# Patient Record
Sex: Female | Born: 1995
Health system: Southern US, Community
[De-identification: ages and names within clinical notes are randomized; demographics above are authoritative.]

## PROBLEM LIST (undated history)

## (undated) DIAGNOSIS — I10 Essential (primary) hypertension: Secondary | ICD-10-CM

## (undated) DIAGNOSIS — D689 Coagulation defect, unspecified: Secondary | ICD-10-CM

## (undated) DIAGNOSIS — F419 Anxiety disorder, unspecified: Secondary | ICD-10-CM

## (undated) DIAGNOSIS — F32A Depression, unspecified: Secondary | ICD-10-CM

## (undated) DIAGNOSIS — D649 Anemia, unspecified: Secondary | ICD-10-CM

## (undated) HISTORY — DX: Depression, unspecified: F32.A

## (undated) HISTORY — DX: Anemia, unspecified: D64.9

## (undated) HISTORY — DX: Essential (primary) hypertension: I10

## (undated) HISTORY — DX: Anxiety disorder, unspecified: F41.9

## (undated) HISTORY — DX: Coagulation defect, unspecified: D68.9

---
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
## 2017-11-16 ENCOUNTER — Ambulatory Visit: Payer: BLUE CROSS/BLUE SHIELD | Admitting: Family Medicine

## 2018-01-07 ENCOUNTER — Encounter: Payer: Self-pay | Admitting: Family Medicine

## 2018-01-07 ENCOUNTER — Ambulatory Visit: Payer: BLUE CROSS/BLUE SHIELD | Admitting: Family Medicine

## 2018-01-07 DIAGNOSIS — F4010 Social phobia, unspecified: Secondary | ICD-10-CM | POA: Insufficient documentation

## 2018-01-07 DIAGNOSIS — Z0000 Encounter for general adult medical examination without abnormal findings: Secondary | ICD-10-CM | POA: Diagnosis not present

## 2018-01-07 DIAGNOSIS — L83 Acanthosis nigricans: Secondary | ICD-10-CM

## 2018-01-07 DIAGNOSIS — Z23 Encounter for immunization: Secondary | ICD-10-CM | POA: Diagnosis not present

## 2018-01-07 DIAGNOSIS — N920 Excessive and frequent menstruation with regular cycle: Secondary | ICD-10-CM | POA: Diagnosis not present

## 2018-01-07 MED ORDER — TETANUS-DIPHTH-ACELL PERTUSSIS 5-2.5-18.5 LF-MCG/0.5 IM SUSP
0.5000 mL | Freq: Once | INTRAMUSCULAR | Status: AC
  Administered 2018-01-07: 0.5 mL via INTRAMUSCULAR

## 2018-01-07 NOTE — Assessment & Plan Note (Addendum)
Patient is not interested in medicine at this time; would consider low dose SSRI if desired in the future; see AVS; suggested therapy, but she declined

## 2018-01-07 NOTE — Progress Notes (Signed)
BP 136/78   Pulse 98   Temp 98.5 F (36.9 C) (Oral)   Resp 14   Ht 5' 3.5" (1.613 m)   Wt (!) 306 lb 3.2 oz (138.9 kg)   LMP 12/30/2017   SpO2 98%   BMI 53.39 kg/m    Subjective:    Patient ID: Emily Huang, female    DOB: 1995/11/06, 22 y.o.   MRN: [id number]  HPI: Emily Huang is a 22 y.o. female  Chief Complaint  Patient presents with  . New Patient (Initial Visit)    HPI Here to establish care I take care of her mother She gets heavy cycles, sometimes 25-30 days apart Periods last 5-7 days; normal for her; used to last 8-9 days; lessened up in her 14s No OCPs or depo; menarche at 6 years of age Mother was anemic; patient does not display No fam of thyroid disease Drinks almond milk, does not drink 2%; brother is lactose intolerant BP up a little; nervous; does not like crowds; only time she goes out is work and getting groceries; more comfortable at home Obese; drinks a lot of water; overweight for "my whole life"; anxiety and nervousness; dealing with stress with pet, drawing nonsmoker  Depression screen PHQ 2/9 01/07/2018  Decreased Interest 0  Down, Depressed, Hopeless 0  PHQ - 2 Score 0    Relevant past medical, surgical, family and social history reviewed History reviewed. No pertinent past medical history. History reviewed. No pertinent surgical history. Family History  Problem Relation Age of Onset  . Anemia Mother   . Hyperlipidemia Mother   . Hypertension Mother   . Migraines Mother   . Kidney disease Maternal Grandmother    Social History   Tobacco Use  . Smoking status: Never Smoker  . Smokeless tobacco: Never Used  Substance Use Topics  . Alcohol use: Yes    Comment: everyother weekend  . Drug use: Not Currently    Interim medical history since last visit reviewed. Allergies and medications reviewed  Review of Systems Per HPI unless specifically indicated above     Objective:    BP 136/78   Pulse 98   Temp 98.5 F (36.9  C) (Oral)   Resp 14   Ht 5' 3.5" (1.613 m)   Wt (!) 306 lb 3.2 oz (138.9 kg)   LMP 12/30/2017   SpO2 98%   BMI 53.39 kg/m   Wt Readings from Last 3 Encounters:  01/07/18 (!) 306 lb 3.2 oz (138.9 kg)    Physical Exam  Constitutional: She appears well-developed and well-nourished. No distress.  Morbidly obese; no distress  HENT:  Head: Normocephalic and atraumatic.  Eyes: EOM are normal. No scleral icterus.  Neck: Thyromegaly (symmetric, adipose tissue versus thyroid enlargement) present.  Cardiovascular: Normal rate, regular rhythm and normal heart sounds.  No murmur heard. Pulmonary/Chest: Effort normal and breath sounds normal. No respiratory distress. She has no wheezes.  Abdominal: Soft. Bowel sounds are normal. She exhibits no distension.  Musculoskeletal: Normal range of motion. She exhibits no edema.  Neurological: She is alert. She exhibits normal muscle tone.  Skin: Skin is warm and dry. She is not diaphoretic. No pallor.  Velvety hyperpigmentation around the nape of the neck with sparing (relative hypopigmentation) of the creases; no palmar erythema, no nailbed pallor  Psychiatric: She has a normal mood and affect. Her behavior is normal. Judgment and thought content normal. Her affect is not inappropriate. Her speech is not rapid and/or pressured.  She does not exhibit a depressed mood.  Good eye contact with examiner; perhaps a little shy and introverted but not visibly anxious or in distress    No results found for this or any previous visit.    Assessment & Plan:   Problem List Items Addressed This Visit      Other   Social anxiety disorder    Patient is not interested in medicine at this time; would consider low dose SSRI if desired in the future; see AVS; suggested therapy, but she declined      Morbid obesity (HCC) - Primary    Encouraged weight loss; see AVS; she politely declined offer for referral to nutritionist or cooking class       Other Visit  Diagnoses    Need for Tdap vaccination       Relevant Medications   Tdap (BOOSTRIX) injection 0.5 mL (Completed)   Menorrhagia with regular cycle       check CBC, ferritin, thyroid   Relevant Orders   Fe+TIBC+Fer   Preventative health care       Relevant Orders   CBC with Differential/Platelet   COMPLETE METABOLIC PANEL WITH GFR   Lipid panel   TSH   Acanthosis nigricans       explained this is a risk factor for prediabetes, likely related to weight; check insulin (nonfasting) and A1c   Relevant Orders   Hemoglobin A1c   Insulin, Free (Bioactive)       Follow up plan: Return in about 8 weeks (around 03/04/2018) for complete physical.  An after-visit summary was printed and given to the patient at check-out.  Please see the patient instructions which may contain other information and recommendations beyond what is mentioned above in the assessment and plan.  Meds ordered this encounter  Medications  . Tdap (BOOSTRIX) injection 0.5 mL    Orders Placed This Encounter  Procedures  . CBC with Differential/Platelet  . Fe+TIBC+Fer  . COMPLETE METABOLIC PANEL WITH GFR  . Lipid panel  . TSH  . Hemoglobin A1c  . Insulin, Free (Bioactive)

## 2018-01-07 NOTE — Patient Instructions (Addendum)
Try to follow the DASH guidelines (DASH stands for Dietary Approaches to Stop Hypertension). Try to limit the sodium in your diet to no more than 1,500mg  of sodium per day. Certainly try to not exceed 2,000 mg per day at the very most. Do not add salt when cooking or at the table.  Check the sodium amount on labels when shopping, and choose items lower in sodium when given a choice. Avoid or limit foods that already contain a lot of sodium. Eat a diet rich in fruits and vegetables and whole grains, and try to lose weight if overweight or obese  Check out the information at familydoctor.org entitled "Nutrition for Weight Loss: What You Need to Know about Fad Diets" Try to lose between 1-2 pounds per week by taking in fewer calories and burning off more calories You can succeed by limiting portions, limiting foods dense in calories and fat, becoming more active, and drinking 8 glasses of water a day (64 ounces) Don't skip meals, especially breakfast, as skipping meals may alter your metabolism Do not use over-the-counter weight loss pills or gimmicks that claim rapid weight loss A healthy BMI (or body mass index) is between 18.5 and 24.9 You can calculate your ideal BMI at the NIH website [employer]  You received the vaccine to protect against tetanus and diphtheria and pertussis today; the tetanus and diphtheria portions will provide protection up to ten years, and the pertussis component will give you protection against whooping cough for life  12 Ways to Curb Anxiety  ?Anxiety is normal human sensation. It is what helped our ancestors survive the pitfalls of the wilderness. Anxiety is defined as experiencing worry or nervousness about an imminent event or something with an uncertain outcome. It is a feeling experienced by most people at some point in their lives. Anxiety can be triggered by a very personal issue, such as the illness of a loved  one, or an event of global proportions, such as a refugee crisis. Some of the symptoms of anxiety are:  Feeling restless.  Having a feeling of impending danger.  Increased heart rate.  Rapid breathing. Sweating.  Shaking.  Weakness or feeling tired.  Difficulty concentrating on anything except the current worry.  Insomnia.  Stomach or bowel problems. What can we do about anxiety we may be feeling? There are many techniques to help manage stress and relax. Here are 12 ways you can reduce your anxiety almost immediately: 1. Turn off the constant feed of information. Take a social media sabbatical. Studies have shown that social media directly contributes to social anxiety.  2. Monitor your television viewing habits. Are you watching shows that are also contributing to your anxiety, such as 24-hour news stations? Try watching something else, or better yet, nothing at all. Instead, listen to music, read an inspirational book or practice a hobby. 3. Eat nutritious meals. Also, don't skip meals and keep healthful snacks on hand. Hunger and poor diet contributes to feeling anxious. 4. Sleep. Sleeping on a regular schedule for at least seven to eight hours a night will do wonders for your outlook when you are awake. 5. Exercise. Regular exercise will help rid your body of that anxious energy and help you get more restful sleep. 6. Try deep (diaphragmatic) breathing. Inhale slowly through your nose for five seconds and exhale through your mouth. 7. Practice acceptance and gratitude. When anxiety hits, accept that there are things out of your control that shouldn't be of immediate concern.  8.  Seek out humor. When anxiety strikes, watch a funny video, read jokes or call a friend who makes you laugh. Laughter is healing for our bodies and releases endorphins that are calming. 9. Stay positive. Take the effort to replace negative thoughts with positive ones. Try to see a stressful situation in a positive  light. Try to come up with solutions rather than dwelling on the problem. 10. Figure out what triggers your anxiety. Keep a journal and make note of anxious moments and the events surrounding them. This will help you identify triggers you can avoid or even eliminate. 11. Talk to someone. Let a trusted friend, family member or even trained professional know that you are feeling overwhelmed and anxious. Verbalize what you are feeling and why.  12. Volunteer. If your anxiety is triggered by a crisis on a large scale, become an advocate and work to resolve the problem that is causing you unease. Anxiety is often unwelcome and can become overwhelming. If not kept in check, it can become a disorder that could require medical treatment. However, if you take the time to care for yourself and avoid the triggers that make you anxious, you will be able to find moments of relaxation and clarity that make your life much more enjoyable.  We'll get labs today If you have not heard anything from my staff in a week about any orders/referrals/studies from today, please contact us here to follow-up [phone]

## 2018-01-07 NOTE — Assessment & Plan Note (Signed)
Encouraged weight loss; see AVS; she politely declined offer for referral to nutritionist or cooking class

## 2018-01-11 LAB — CBC WITH DIFFERENTIAL/PLATELET
BASOS PCT: 0.4 %
Basophils Absolute: 34 cells/uL (ref 0–200)
EOS PCT: 0.5 %
Eosinophils Absolute: 42 cells/uL (ref 15–500)
HCT: 36.9 % (ref 35.0–45.0)
Hemoglobin: 12.1 g/dL (ref 11.7–15.5)
Lymphs Abs: 2562 cells/uL (ref 850–3900)
MCH: 25.6 pg — ABNORMAL LOW (ref 27.0–33.0)
MCHC: 32.8 g/dL (ref 32.0–36.0)
MCV: 78.2 fL — ABNORMAL LOW (ref 80.0–100.0)
MONOS PCT: 6.9 %
MPV: 9.4 fL (ref 7.5–12.5)
Neutro Abs: 5183 cells/uL (ref 1500–7800)
Neutrophils Relative %: 61.7 %
Platelets: 407 10*3/uL — ABNORMAL HIGH (ref 140–400)
RBC: 4.72 10*6/uL (ref 3.80–5.10)
RDW: 14.4 % (ref 11.0–15.0)
Total Lymphocyte: 30.5 %
WBC mixed population: 580 cells/uL (ref 200–950)
WBC: 8.4 10*3/uL (ref 3.8–10.8)

## 2018-01-11 LAB — TSH: TSH: 3.01 m[IU]/L

## 2018-01-11 LAB — COMPLETE METABOLIC PANEL WITH GFR
AG RATIO: 1.5 (calc) (ref 1.0–2.5)
ALT: 14 U/L (ref 6–29)
AST: 15 U/L (ref 10–30)
Albumin: 4.4 g/dL (ref 3.6–5.1)
Alkaline phosphatase (APISO): 69 U/L (ref 33–115)
BILIRUBIN TOTAL: 0.4 mg/dL (ref 0.2–1.2)
BUN: 14 mg/dL (ref 7–25)
CHLORIDE: 102 mmol/L (ref 98–110)
CO2: 30 mmol/L (ref 20–32)
Calcium: 9.8 mg/dL (ref 8.6–10.2)
Creat: 0.77 mg/dL (ref 0.50–1.10)
GFR, EST AFRICAN AMERICAN: 126 mL/min/{1.73_m2} (ref >=60)
GFR, Est Non African American: 109 mL/min/{1.73_m2} (ref >=60)
GLUCOSE: 74 mg/dL (ref 65–99)
Globulin: 2.9 g/dL (calc) (ref 1.9–3.7)
POTASSIUM: 4.6 mmol/L (ref 3.5–5.3)
Sodium: 140 mmol/L (ref 135–146)
Total Protein: 7.3 g/dL (ref 6.1–8.1)

## 2018-01-11 LAB — LIPID PANEL
CHOLESTEROL: 212 mg/dL — AB (ref <200)
HDL: 59 mg/dL (ref >50)
LDL Cholesterol (Calc): 121 mg/dL (calc) — ABNORMAL HIGH
Non-HDL Cholesterol (Calc): 153 mg/dL (calc) — ABNORMAL HIGH (ref <130)
TRIGLYCERIDES: 206 mg/dL — AB (ref <150)
Total CHOL/HDL Ratio: 3.6 (calc) (ref <5.0)

## 2018-01-11 LAB — INSULIN, FREE (BIOACTIVE): Insulin, Free: 19.6 u[IU]/mL — ABNORMAL HIGH (ref 1.5–14.9)

## 2018-01-11 LAB — HEMOGLOBIN A1C
EAG (MMOL/L): 6.3 (calc)
Hgb A1c MFr Bld: 5.6 % of total Hgb (ref <5.7)
Mean Plasma Glucose: 114 (calc)

## 2018-01-11 LAB — IRON,TIBC AND FERRITIN PANEL
%SAT: 8 % (calc) — ABNORMAL LOW (ref 11–50)
FERRITIN: 7 ng/mL — AB (ref 10–154)
IRON: 37 ug/dL — AB (ref 40–190)
TIBC: 453 mcg/dL (calc) — ABNORMAL HIGH (ref 250–450)

## 2018-01-15 ENCOUNTER — Other Ambulatory Visit: Payer: Self-pay | Admitting: Family Medicine

## 2018-01-15 DIAGNOSIS — E8881 Metabolic syndrome: Secondary | ICD-10-CM | POA: Insufficient documentation

## 2018-01-15 MED ORDER — METFORMIN HCL ER 500 MG PO TB24: ORAL_TABLET | ORAL | 0 refills | Status: DC

## 2018-01-15 NOTE — Progress Notes (Signed)
Start metformin.

## 2018-05-20 ENCOUNTER — Telehealth: Payer: Self-pay

## 2018-05-20 NOTE — Telephone Encounter (Signed)
Left voicemail that Dr. Lada wanted me to call she is trying to close care gaps and would like for you to come in for chlamydia testing by the end of the week.  Please call back if you have any questions for her. 

## 2018-06-05 ENCOUNTER — Telehealth: Payer: Self-pay | Admitting: Family Medicine

## 2018-06-05 NOTE — Telephone Encounter (Signed)
Patient established care back in May and was supposed to return for a physical She is overdue now for labs too (they were due in July); please ask her to schedule a visit ASAP Please see note from May: Notes recorded by Kerman Passey, MD on 01/08/2018 at 6:34 PM EDT Asher Muir, please let the patient know that her iron level is low. Let's have her start iron supplementation daily for the next two months, and then recheck her labs. She can take OTC ferrous sulfate 324 or 325 mg daily, best if taken on an empty stomach with either orange juice or a vitamin C tablet (to help absorption). Please ORDER: CBC, ferritin, diagnosis iron deficiency anemia, to be done in 2 months; thank you

## 2018-06-06 NOTE — Telephone Encounter (Signed)
Called number in chart, no voicemail setup.  Called emergency contact left voicemail to have Emily Huang call our office.  CRM created.

## 2019-03-04 ENCOUNTER — Ambulatory Visit: Payer: Self-pay

## 2019-03-04 DIAGNOSIS — Z20822 Contact with and (suspected) exposure to covid-19: Secondary | ICD-10-CM

## 2019-03-04 DIAGNOSIS — Z20828 Contact with and (suspected) exposure to other viral communicable diseases: Secondary | ICD-10-CM

## 2019-03-04 NOTE — Telephone Encounter (Signed)
Pt mother called to say that Ms shilah was exposed to 2 Covid-19 positive co workers. Phone passed to [place]. Pt states that 2 co workers have tested positive where she works. She states that she does not work in the department with the two workers the two workers do not work in the same department with each other. She has been sent home for testing. She has no symptoms. She will quarantine herself from her family. Her mother is going through cancer CHEMO treatments. Ms Luetta will create a sick room and stay isolated from her mother. Care advice read to Dor[employer] she verbalized understanding of all instruction. Note will be routed to office for follow-up in the AM.  Reason for Disposition . [1] COVID-19 EXPOSURE (Close Contact) within last 14 days AND [2] needs COVID-19 lab test to return to work AND [3] NO symptoms  Answer Assessment - Initial Assessment Questions 1. CLOSE CONTACT: "Who is the person with the confirmed or suspected COVID-19 infection that you were exposed to?"     Co worker x2 2. PLACE of CONTACT: "Where were you when you were exposed to COVID-19?" (e.g., home, school, medical waiting room; which city?)     work 3. TYPE of CONTACT: "How much contact was there?" (e.g., sitting next to, live in same house, work in same office, same building)     [place] departments 4. DURATION of CONTACT: "How long were you in contact with the COVID-19 patient?" (e.g., a few seconds, passed by person, a few minutes, live with the patient)     Passing by 5. DATE of CONTACT: "When did you have contact with a COVID-19 patient?" (e.g., how many days ago)     today 6. TRAVEL: "Have you traveled out of the country recently?" If so, "When and where?"     * Also ask about out-of-state travel, since the CDC has identified some high-risk cities for community spread in the [place].     * Note: Travel becomes less relevant if there is widespread community transmission where the patient lives.    No 7. COMMUNITY  SPREAD: "Are there lots of cases of COVID-19 (community spread) where you live?" (See public health department website, if unsure)       [place] 8. SYMPTOMS: "Do you have any symptoms?" (e.g., fever, cough, breathing difficulty)     no 9. PREGNANCY OR POSTPARTUM: "Is there any chance you are pregnant?" "When was your last menstrual period?" "Did you deliver in the last 2 weeks?"    No on today 10. HIGH RISK: "Do you have any heart or lung problems? Do you have a weak immune system?" (e.g., CHF, COPD, asthma, HIV positive, chemotherapy, renal failure, diabetes mellitus, sickle cell anemia)       none  Protocols used: CORONAVIRUS (COVID-19) EXPOSURE-A-AH

## 2019-03-05 ENCOUNTER — Ambulatory Visit: Payer: BLUE CROSS/BLUE SHIELD | Admitting: Family Medicine

## 2019-03-05 ENCOUNTER — Other Ambulatory Visit: Payer: Self-pay

## 2019-03-05 NOTE — Telephone Encounter (Signed)
Tried to call several times no voicemail setup

## 2019-03-05 NOTE — Telephone Encounter (Signed)
She will need to obtain testing through the health department or [facility]'s COVID-19 hotline.  Please call [facility]'s COVID-19 hotline at [phone] to determine if you are eligible for testing.

## 2019-03-05 NOTE — Addendum Note (Signed)
Addended by: Hubbard Hartshorn on: 03/05/2019 11:49 AM   Modules accepted: Orders

## 2020-09-09 ENCOUNTER — Encounter: Payer: Self-pay | Admitting: Family Medicine

## 2020-09-30 ENCOUNTER — Other Ambulatory Visit: Payer: Self-pay

## 2020-09-30 ENCOUNTER — Ambulatory Visit (INDEPENDENT_AMBULATORY_CARE_PROVIDER_SITE_OTHER): Payer: 59 | Admitting: Family Medicine

## 2020-09-30 ENCOUNTER — Encounter: Payer: Self-pay | Admitting: Family Medicine

## 2020-09-30 VITALS — BP 124/72 | HR 98 | Temp 98.1°F | Resp 18 | Ht 64.0 in | Wt 362.9 lb

## 2020-09-30 DIAGNOSIS — Z803 Family history of malignant neoplasm of breast: Secondary | ICD-10-CM

## 2020-09-30 DIAGNOSIS — Z1159 Encounter for screening for other viral diseases: Secondary | ICD-10-CM

## 2020-09-30 DIAGNOSIS — E8881 Metabolic syndrome: Secondary | ICD-10-CM | POA: Diagnosis not present

## 2020-09-30 DIAGNOSIS — N921 Excessive and frequent menstruation with irregular cycle: Secondary | ICD-10-CM

## 2020-09-30 DIAGNOSIS — Z0000 Encounter for general adult medical examination without abnormal findings: Secondary | ICD-10-CM

## 2020-09-30 DIAGNOSIS — E88819 Insulin resistance, unspecified: Secondary | ICD-10-CM

## 2020-09-30 DIAGNOSIS — E66813 Obesity, class 3: Secondary | ICD-10-CM

## 2020-09-30 DIAGNOSIS — Z6841 Body Mass Index (BMI) 40.0 and over, adult: Secondary | ICD-10-CM

## 2020-09-30 DIAGNOSIS — Z124 Encounter for screening for malignant neoplasm of cervix: Secondary | ICD-10-CM

## 2020-09-30 DIAGNOSIS — F4010 Social phobia, unspecified: Secondary | ICD-10-CM

## 2020-09-30 DIAGNOSIS — E785 Hyperlipidemia, unspecified: Secondary | ICD-10-CM

## 2020-09-30 DIAGNOSIS — D500 Iron deficiency anemia secondary to blood loss (chronic): Secondary | ICD-10-CM

## 2020-09-30 DIAGNOSIS — F321 Major depressive disorder, single episode, moderate: Secondary | ICD-10-CM

## 2020-09-30 DIAGNOSIS — Z114 Encounter for screening for human immunodeficiency virus [HIV]: Secondary | ICD-10-CM

## 2020-09-30 LAB — CBC WITH DIFFERENTIAL/PLATELET
HCT: 37.1 % (ref 35.0–45.0)
Hemoglobin: 11 g/dL — ABNORMAL LOW (ref 11.7–15.5)
Lymphs Abs: 2623 cells/uL (ref 850–3900)
MCHC: 29.6 g/dL — ABNORMAL LOW (ref 32.0–36.0)
MCV: 69.2 fL — ABNORMAL LOW (ref 80.0–100.0)
MPV: 9.6 fL (ref 7.5–12.5)
Monocytes Relative: 7 %

## 2020-09-30 MED ORDER — FLUOXETINE HCL 10 MG PO TABS: 10.0000 mg | ORAL_TABLET | Freq: Every day | ORAL | 3 refills | Status: DC

## 2020-09-30 NOTE — Progress Notes (Signed)
Patient: Emily Huang, Female    DOB: 08-29-1996, 25 y.o.   MRN: [id number] Delsa Grana, PA-C Visit Date: 09/30/2020  Today's Provider: Delsa Grana, PA-C   Chief Complaint  Patient presents with  . Annual Exam   Subjective:   Pt presents for CPE, new to me  Annual physical exam:  Emily Huang is a 25 y.o. female who presents today for complete physical exam:  Exercise/Activity:  Not exercising Diet/nutrition:  Tries to eat veggies/spinach Sleep: poor sleep  Pt wished to discuss acute complaints and do routine f/up on chronic conditions today in addition to CPE. Advised pt of separate visit billing/coding  Anxiety/depression - very anxious about many things, lives in a bad area, she has "thoughts" but denies any SI or plan Family hx of anxiety Pt is anxious about health/hygiene, she showers 2x a day and is meticulous about social distancing, masking, and washing clothes and body.  She is vaccinated She worries about getting harmed in her neighborhood, notes its not safe for her and she worries all the time Poor sleep with history of working 3rd shift  Change to menses in the past 3+ months - heavy and irregular bleeding - used to last less than a week, now normal to bleed up to 2-3 weeks at a time, heavy bleeding soaks thick pads and period underwear - new for her, she is changing more than every half hour. She is drinking a lot of water, hasn't seen anyone for this before, not sexually active, no hx of using birth control, no known coagulopathies/bleeding disorders for her or in family, no past hx of uterine fibroids. Mom was dx last year with breast CA - she doesn't know specifics about genetics or cancer markers etc, but she never was told to get tested for anything, she is anxious about constantly doing self breast exams.  USPSTF grade A and B recommendations - reviewed and addressed today  Depression:  Phq 9 completed today by patient, was reviewed by me with  patient in the room PHQ score is positive , pt feels depressed and anxious PHQ 2/9 Scores 09/30/2020 01/07/2018  PHQ - 2 Score 4 0  PHQ- 9 Score 11 -   Depression screen [facility] 2/9 09/30/2020 01/07/2018  Decreased Interest 1 0  Down, Depressed, Hopeless 3 0  PHQ - 2 Score 4 0  Altered sleeping 2 -  Tired, decreased energy 1 -  Change in appetite 0 -  Feeling bad or failure about yourself  3 -  Trouble concentrating 0 -  Moving slowly or fidgety/restless 1 -  Suicidal thoughts 0 -  PHQ-9 Score 11 -  Difficult doing work/chores Somewhat difficult -    Alcohol screening: [place] Office Visit from 09/30/2020 in [facility]  AUDIT-C Score 0      Immunizations and Health Maintenance: Health Maintenance  Topic Date Due  . Hepatitis C Screening  Never done  . COVID-19 Vaccine (1) Never done  . PAP-Cervical Cytology Screening  Never done  . PAP SMEAR-Modifier  Never done  . TETANUS/TDAP  01/08/2028  . INFLUENZA VACCINE  Completed  . HIV Screening  Completed     Hep C Screening:  due  STD testing and prevention (HIV/chl/gon/syphilis) never sexually active - declined  Intimate partner violence: denies  Sexual History/Pain during Intercourse: Single  Menstrual History/LMP/Abnormal Bleeding: heavy bleeding x 3 months, replacing heavy pads and period underwear every 10-20 min, periods lasting over a 7 to 21  d Patient's last menstrual period was 08/28/2020. and she is still bleeding  Incontinence Symptoms: denies  Breast cancer:  Mother with new dx at age 61 Last Mammogram: *see HM list above BRCA gene screening: ? Unknown, pt to find out and let us know  Cervical cancer screening: declines today with menses - discussed guidelines - recommended pap when she can and HPV screening starting at age 13  - with heavy bleeding discussed deferring to GYN  Pt denies known  family hx of cancers - ovarian, uterine, colon  Osteoporosis:  n/a  Skin cancer:  Hx of skin  CA -  No Discussed atypical lesions   Colorectal cancer:   Colonoscopy is not due per age Discussed concerning signs and sx of CRC, pt denies any concerning sx  Lung cancer:   Low Dose CT Chest recommended if Age 5-80 years, 30 pack-year currently smoking OR have quit w/in 15years. Patient does not qualify.    Social History   Tobacco Use  . Smoking status: Never Smoker  . Smokeless tobacco: Never Used  Vaping Use  . Vaping Use: Never used  Substance Use Topics  . Alcohol use: Yes    Comment: everyother weekend  . Drug use: Not Currently     Flowsheet Row Office Visit from 09/30/2020 in [facility]  AUDIT-C Score 0      Family History  Problem Relation Age of Onset  . Anemia Mother   . Hyperlipidemia Mother   . Hypertension Mother   . Migraines Mother   . Kidney disease Maternal Grandmother      Blood pressure/Hypertension: BP Readings from Last 3 Encounters:  09/30/20 124/72  01/07/18 136/78    Weight/Obesity: Wt Readings from Last 3 Encounters:  09/30/20 (!) 362 lb 14.4 oz (164.6 kg)  01/07/18 (!) 306 lb 3.2 oz (138.9 kg)   BMI Readings from Last 3 Encounters:  09/30/20 62.29 kg/m  01/07/18 53.39 kg/m     Lipids:  Lab Results  Component Value Date   CHOL 212 (H) 01/07/2018   Lab Results  Component Value Date   HDL 59 01/07/2018   Lab Results  Component Value Date   LDLCALC 121 (H) 01/07/2018   Lab Results  Component Value Date   TRIG 206 (H) 01/07/2018   Lab Results  Component Value Date   CHOLHDL 3.6 01/07/2018   No results found for: LDLDIRECT Based on the results of lipid panel his/her cardiovascular risk factor ( using [place] )  in the next 10 years is: The ASCVD Risk score Mikey Bussing DC Jr., et al., 2013) failed to calculate for the following reasons:   The 2013 ASCVD risk score is only valid for ages 56 to 47 Glucose:  Glucose, Bld  Date Value Ref Range Status  01/07/2018 74 65 - 99 mg/dL Final     Comment:    .            Fasting reference interval .   05/08/2008 79  Final   Hypertension: BP Readings from Last 3 Encounters:  09/30/20 124/72  01/07/18 136/78   Obesity: Wt Readings from Last 3 Encounters:  09/30/20 (!) 362 lb 14.4 oz (164.6 kg)  01/07/18 (!) 306 lb 3.2 oz (138.9 kg)   BMI Readings from Last 3 Encounters:  09/30/20 62.29 kg/m  01/07/18 53.39 kg/m      Advanced Care Planning:  A voluntary discussion about advance care planning including the explanation and discussion of advance directives.  Discussed health care proxy and Living will, and the patient was able to identify a health care proxy as her mother.   Patient does not have a living will at present time.   Social History      She        Social History   Socioeconomic History  . Marital status: Single    Spouse name: Not on file  . Number of children: Not on file  . Years of education: Not on file  . Highest education level: Not on file  Occupational History  . Not on file  Tobacco Use  . Smoking status: Never Smoker  . Smokeless tobacco: Never Used  Vaping Use  . Vaping Use: Never used  Substance and Sexual Activity  . Alcohol use: Yes    Comment: everyother weekend  . Drug use: Not Currently  . Sexual activity: Not Currently  Other Topics Concern  . Not on file  Social History Narrative  . Not on file   Social Determinants of Health   Financial Resource Strain: Medium Risk  . Difficulty of Paying Living Expenses: Somewhat hard  Food Insecurity: Not on file  Transportation Needs: Unmet Transportation Needs  . Lack of Transportation (Medical): Yes  . Lack of Transportation (Non-Medical): Yes  Physical Activity: Inactive  . Days of Exercise per Week: 0 days  . Minutes of Exercise per Session: 0 min  Stress: Stress Concern Present  . Feeling of Stress : Very much  Social Connections: Unknown  . Frequency of Communication with Friends and Family: Once a week  . Frequency  of Social Gatherings with Friends and Family: Once a week  . Attends Religious Services: 1 to 4 times per year  . Active Member of Clubs or Organizations: No  . Attends Archivist Meetings: Never  . Marital Status: Not on file    Family History        Family History  Problem Relation Age of Onset  . Anemia Mother   . Hyperlipidemia Mother   . Hypertension Mother   . Migraines Mother   . Kidney disease Maternal Grandmother     Patient Active Problem List   Diagnosis Date Noted  . Insulin resistance 01/15/2018  . Morbid obesity ([place]) 01/07/2018  . Social anxiety disorder 01/07/2018    History reviewed. No pertinent surgical history.   Current Outpatient Medications:  .  metFORMIN (GLUCOPHAGE XR) 500 MG 24 hr tablet, One by mouth daily for one week, then two a day (Patient not taking: Reported on 09/30/2020), Disp: 53 tablet, Rfl: 0  No Known Allergies  Patient Care Team: Delsa Grana, PA-C as PCP - General (Family Medicine)  Review of Systems  Constitutional: Negative.  Negative for activity change, appetite change and unexpected weight change.  HENT: Negative.   Eyes: Negative.   Respiratory: Negative.  Negative for apnea, cough, choking, chest tightness, shortness of breath and wheezing.   Cardiovascular: Negative.  Negative for chest pain and palpitations.  Gastrointestinal: Negative.   Endocrine: Negative.  Negative for cold intolerance.  Genitourinary: Positive for menstrual problem and vaginal bleeding. Negative for hematuria, pelvic pain, vaginal discharge and vaginal pain.  Musculoskeletal: Negative.   Skin: Negative.  Negative for pallor.  Allergic/Immunologic: Negative.   Neurological: Negative.  Negative for dizziness, syncope, weakness and light-headedness.  Hematological: Negative.   Psychiatric/Behavioral: Negative.  Negative for suicidal ideas.  All other systems reviewed and are negative.     I personally reviewed active problem list,  medication list, allergies, family history, social history, health maintenance, notes from last encounter, lab results, imaging with the patient/caregiver today.        Objective:   Vitals:  Vitals:   09/30/20 1048  BP: 124/72  Pulse: 98  Resp: 18  Temp: 98.1 F (36.7 C)  SpO2: 97%  Weight: (!) 362 lb 14.4 oz (164.6 kg)  Height: 5' 4"  (1.626 m)    Body mass index is 62.29 kg/m.  Physical Exam Vitals and nursing note reviewed.  Constitutional:      General: She is not in acute distress.    Appearance: Normal appearance. She is well-developed. She is obese. She is not ill-appearing, toxic-appearing or diaphoretic.     Interventions: Face mask in place.  HENT:     Head: Normocephalic and atraumatic.     Right Ear: External ear normal.     Left Ear: External ear normal.  Eyes:     General: Lids are normal. No scleral icterus.       Right eye: No discharge.        Left eye: No discharge.     Conjunctiva/sclera: Conjunctivae normal.  Neck:     Trachea: Phonation normal. No tracheal deviation.  Cardiovascular:     Rate and Rhythm: Normal rate and regular rhythm.     Pulses: Normal pulses.          Radial pulses are 2+ on the right side and 2+ on the left side.       Posterior tibial pulses are 2+ on the right side and 2+ on the left side.     Heart sounds: Normal heart sounds. No murmur heard. No friction rub. No gallop.   Pulmonary:     Effort: Pulmonary effort is normal. No respiratory distress.     Breath sounds: Normal breath sounds. No stridor. No wheezing, rhonchi or rales.  Chest:     Chest wall: No tenderness.  Abdominal:     General: Bowel sounds are normal. There is no distension.     Palpations: Abdomen is soft.     Tenderness: There is no abdominal tenderness. There is no right CVA tenderness or left CVA tenderness.  Musculoskeletal:     Right lower leg: No edema.     Left lower leg: No edema.  Skin:    General: Skin is warm and dry.     Capillary  Refill: Capillary refill takes less than 2 seconds.     Coloration: Skin is not jaundiced or pale.     Findings: No rash.  Neurological:     Mental Status: She is alert.     Motor: No abnormal muscle tone.     Gait: Gait normal.  Psychiatric:        Mood and Affect: Mood is anxious and depressed.        Speech: Speech normal.        Behavior: Behavior normal. Behavior is cooperative.        Thought Content: Thought content does not include homicidal or suicidal ideation. Thought content does not include homicidal or suicidal plan.       Fall Risk: Fall Risk  09/30/2020 01/07/2018  Falls in the past year? 0 No  Number falls in past yr: 0 -  Injury with Fall? 0 -    Functional Status Survey: Is the patient deaf or have difficulty hearing?: No Does the patient have difficulty seeing, even when wearing glasses/contacts?: No Does the patient have difficulty concentrating,  remembering, or making decisions?: No Does the patient have difficulty walking or climbing stairs?: No Does the patient have difficulty dressing or bathing?: No Does the patient have difficulty doing errands alone such as visiting a doctor's office or shopping?: No   Assessment & Plan:    CPE completed today  . USPSTF grade A and B recommendations reviewed with patient; age-appropriate recommendations, preventive care, screening tests, etc discussed and encouraged; healthy living encouraged; see AVS for patient education given to patient  . Discussed importance of 150 minutes of physical activity weekly, AHA exercise recommendations given to pt in AVS/handout  . Discussed importance of healthy diet:  eating lean meats and proteins, avoiding trans fats and saturated fats, avoid simple sugars and excessive carbs in diet, eat 6 servings of fruit/vegetables daily and drink plenty of water and avoid sweet beverages.    . Recommended pt to do annual eye exam and routine dental exams/cleanings  . Depression, alcohol,  fall screening completed as documented above and per flowsheets  . Reviewed Health Maintenance: Health Maintenance  Topic Date Due  . Hepatitis C Screening  Never done  . COVID-19 Vaccine (1) Never done  . PAP SMEAR-Modifier  09/30/2020 (Originally 11/17/2015)  . PAP-Cervical Cytology Screening  09/30/2021 (Originally 11/17/2015)  . TETANUS/TDAP  01/08/2028  . INFLUENZA VACCINE  Completed  . HIV Screening  Completed  Pt reports she is vaccinated for COVID - need to put in chart/update  . Immunizations: Immunization History  Administered Date(s) Administered  . Influenza-Unspecified 09/29/2020  . Tdap 01/07/2018        ICD-10-CM   1. Annual physical exam  Z00.00 Lipid panel    COMPLETE METABOLIC PANEL WITH GFR    TSH    Hemoglobin A1C  2. Encounter for hepatitis C screening test for low risk patient  Z11.59 Hepatitis C Antibody   agreed to screening today  3. Class 3 severe obesity with body mass index (BMI) of 60.0 to 69.9 in adult, unspecified obesity type, unspecified whether serious comorbidity present (HCC)  E66.01 Lipid panel   K53.97 COMPLETE METABOLIC PANEL WITH GFR    TSH    Hemoglobin A1C   weight gain, but pt did not want to talk about it, denied knowing if she had any weight changes  4. Insulin resistance  E88.81 Lipid panel    COMPLETE METABOLIC PANEL WITH GFR    Hemoglobin A1C   no longer taking metformin, lost to f/up more than 2 years ago  5. Social anxiety disorder  F40.10 FLUoxetine (PROZAC) 10 MG tablet    Ambulatory referral to Psychiatry   severe anxiety, recommended consulting psych/therapist and trying medications  6. Menorrhagia with irregular cycle  N92.1 Ambulatory referral to Gynecology    CBC with Differential/Platelet    Iron, TIBC and Ferritin Panel   screening for anemia today, no sx concerning for severe or sx anemia, refer to specialists for eval  7. Hyperlipidemia, unspecified hyperlipidemia type  E78.5 Lipid panel    COMPLETE METABOLIC  PANEL WITH GFR    Hemoglobin A1C   hx of elevated cholesterol, mordibly obese  8. Screening for HIV without presence of risk factors  Z11.4    declines, states she has never been sexually active, low risk  9. Screening for malignant neoplasm of cervix  Z12.4    pt declines today, reviewed and discussed guidelines, encouraged her to do with gyn/specialists  10. Current moderate episode of major depressive disorder, unspecified whether recurrent (HCC)  F32.1 FLUoxetine (PROZAC)  10 MG tablet    Ambulatory referral to Psychiatry   depressive thoughts, denies SI, willing to try med, encouraged her to consult with psychiatry  11. Family history of breast cancer in mother  Z80.3    pt to get some more info   69. Iron deficiency anemia due to chronic blood loss  D50.0 CBC with Differential/Platelet    Iron, TIBC and Ferritin Panel     Delsa Grana, PA-C 09/30/20 11:07 AM  [facility]

## 2020-09-30 NOTE — Patient Instructions (Signed)
Managing Anxiety, Adult After being diagnosed with an anxiety disorder, you may be relieved to know why you have felt or behaved a certain way. You may also feel overwhelmed about the treatment ahead and what it will mean for your life. With care and support, you can manage this condition and recover from it. How to manage lifestyle changes Managing stress and anxiety Stress is your body's reaction to life changes and events, both good and bad. Most stress will last just a few hours, but stress can be ongoing and can lead to more than just stress. Although stress can play a major role in anxiety, it is not the same as anxiety. Stress is usually caused by something external, such as a deadline, test, or competition. Stress normally passes after the triggering event has ended.  Anxiety is caused by something internal, such as imagining a terrible outcome or worrying that something will go wrong that will devastate you. Anxiety often does not go away even after the triggering event is over, and it can become long-term (chronic) worry. It is important to understand the differences between stress and anxiety and to manage your stress effectively so that it does not lead to an anxious response. Talk with your health care provider or a counselor to learn more about reducing anxiety and stress. He or she may suggest tension reduction techniques, such as:  Music therapy. This can include creating or listening to music that you enjoy and that inspires you.  Mindfulness-based meditation. This involves being aware of your normal breaths while not trying to control your breathing. It can be done while sitting or walking.  Centering prayer. This involves focusing on a word, phrase, or sacred image that means something to you and brings you peace.  Deep breathing. To do this, expand your stomach and inhale slowly through your nose. Hold your breath for 3-5 seconds. Then exhale slowly, letting your stomach muscles  relax.  Self-talk. This involves identifying thought patterns that lead to anxiety reactions and changing those patterns.  Muscle relaxation. This involves tensing muscles and then relaxing them. Choose a tension reduction technique that suits your lifestyle and personality. These techniques take time and practice. Set aside 5-15 minutes a day to do them. Therapists can offer counseling and training in these techniques. The training to help with anxiety may be covered by some insurance plans. Other things you can do to manage stress and anxiety include:  Keeping a stress/anxiety diary. This can help you learn what triggers your reaction and then learn ways to manage your response.  Thinking about how you react to certain situations. You may not be able to control everything, but you can control your response.  Making time for activities that help you relax and not feeling guilty about spending your time in this way.  Visual imagery and yoga can help you stay calm and relax.   Medicines Medicines can help ease symptoms. Medicines for anxiety include:  Anti-anxiety drugs.  Antidepressants. Medicines are often used as a primary treatment for anxiety disorder. Medicines will be prescribed by a health care provider. When used together, medicines, psychotherapy, and tension reduction techniques may be the most effective treatment. Relationships Relationships can play a big part in helping you recover. Try to spend more time connecting with trusted friends and family members. Consider going to couples counseling, taking family education classes, or going to family therapy. Therapy can help you and others better understand your condition. How to recognize changes in your   anxiety Everyone responds differently to treatment for anxiety. Recovery from anxiety happens when symptoms decrease and stop interfering with your daily activities at home or work. This may mean that you will start to:  Have  better concentration and focus. Worry will interfere less in your daily thinking.  Sleep better.  Be less irritable.  Have more energy.  Have improved memory. It is important to recognize when your condition is getting worse. Contact your health care provider if your symptoms interfere with home or work and you feel like your condition is not improving. Follow these instructions at home: Activity  Exercise. Most adults should do the following: ? Exercise for at least 150 minutes each week. The exercise should increase your heart rate and make you sweat (moderate-intensity exercise). ? Strengthening exercises at least twice a week.  Get the right amount and quality of sleep. Most adults need 7-9 hours of sleep each night. Lifestyle  Eat a healthy diet that includes plenty of vegetables, fruits, whole grains, low-fat dairy products, and lean protein. Do not eat a lot of foods that are high in solid fats, added sugars, or salt.  Make choices that simplify your life.  Do not use any products that contain nicotine or tobacco, such as cigarettes, e-cigarettes, and chewing tobacco. If you need help quitting, ask your health care provider.  Avoid caffeine, alcohol, and certain over-the-counter cold medicines. These may make you feel worse. Ask your pharmacist which medicines to avoid.   General instructions  Take over-the-counter and prescription medicines only as told by your health care provider.  Keep all follow-up visits as told by your health care provider. This is important. Where to find support You can get help and support from these sources:  Self-help groups.  Online and [employer].  A trusted spiritual leader.  Couples counseling.  Family education classes.  Family therapy. Where to find more information You may find that joining a support group helps you deal with your anxiety. The following sources can help you locate counselors or support groups near  you:  [place]: www.mentalhealthamerica.net  Anxiety and Depression Association of [place] (ADAA): https://www.clark.net/  National Alliance on Mental Illness (NAMI): www.nami.org Contact a health care provider if you:  Have a hard time staying focused or finishing daily tasks.  Spend many hours a day feeling worried about everyday life.  Become exhausted by worry.  Start to have headaches, feel tense, or have nausea.  Urinate more than normal.  Have diarrhea. Get help right away if you have:  A racing heart and shortness of breath.  Thoughts of hurting yourself or others. If you ever feel like you may hurt yourself or others, or have thoughts about taking your own life, get help right away. You can go to your nearest emergency department or call:  Your local emergency services (911 in the [place]  A suicide crisis helpline, such as the [place] at 915-581-2664. This is open 24 hours a day. Summary  Taking steps to learn and use tension reduction techniques can help calm you and help prevent triggering an anxiety reaction.  When used together, medicines, psychotherapy, and tension reduction techniques may be the most effective treatment.  Family, friends, and partners can play a big part in helping you recover from an anxiety disorder. This information is not intended to replace advice given to you by your health care provider. Make sure you discuss any questions you have with your health care provider. Document  Revised: 01/21/2019 Document Reviewed: 01/21/2019 Elsevier Patient Education  [place] 27-8 Years Old, Female Preventive care refers to lifestyle choices and visits with your health care provider that can promote health and wellness. This includes:  A yearly physical exam. This is also called an annual wellness visit.  Regular dental and eye exams.  Immunizations.  Screening for certain  conditions.  Healthy lifestyle choices, such as: ? Eating a healthy diet. ? Getting regular exercise. ? Not using drugs or products that contain nicotine and tobacco. ? Limiting alcohol use. What can I expect for my preventive care visit? Physical exam Your health care provider may check your:  Height and weight. These may be used to calculate your BMI (body mass index). BMI is a measurement that tells if you are at a healthy weight.  Heart rate and blood pressure.  Body temperature.  Skin for abnormal spots. Counseling Your health care provider may ask you questions about your:  Past medical problems.  Family's medical history.  Alcohol, tobacco, and drug use.  Emotional well-being.  Home life and relationship well-being.  Sexual activity.  Diet, exercise, and sleep habits.  Work and work Statistician.  Access to firearms.  Method of birth control.  Menstrual cycle.  Pregnancy history. What immunizations do I need? Vaccines are usually given at various ages, according to a schedule. Your health care provider will recommend vaccines for you based on your age, medical history, and lifestyle or other factors, such as travel or where you work.   What tests do I need? Blood tests  Lipid and cholesterol levels. These may be checked every 5 years starting at age 33.  Hepatitis C test.  Hepatitis B test. Screening  Diabetes screening. This is done by checking your blood sugar (glucose) after you have not eaten for a while (fasting).  STD (sexually transmitted disease) testing, if you are at risk.  BRCA-related cancer screening. This may be done if you have a family history of breast, ovarian, tubal, or peritoneal cancers.  Pelvic exam and Pap test. This may be done every 3 years starting at age 89. Starting at age 80, this may be done every 5 years if you have a Pap test in combination with an HPV test. Talk with your health care provider about your test  results, treatment options, and if necessary, the need for more tests.   Follow these instructions at home: Eating and drinking  Eat a healthy diet that includes fresh fruits and vegetables, whole grains, lean protein, and low-fat dairy products.  Take vitamin and mineral supplements as recommended by your health care provider.  Do not drink alcohol if: ? Your health care provider tells you not to drink. ? You are pregnant, may be pregnant, or are planning to become pregnant.  If you drink alcohol: ? Limit how much you have to 0-1 drink a day. ? Be aware of how much alcohol is in your drink. In the [place] one drink equals one 12 oz bottle of beer (355 mL), one 5 oz glass of wine (148 mL), or one 1 oz glass of hard liquor (44 mL).   Lifestyle  Take daily care of your teeth and gums. Brush your teeth every morning and night with fluoride toothpaste. Floss one time each day.  Stay active. Exercise for at least 30 minutes 5 or more days each week.  Do not use any products that contain nicotine or tobacco, such as cigarettes, e-cigarettes,  and chewing tobacco. If you need help quitting, ask your health care provider.  Do not use drugs.  If you are sexually active, practice safe sex. Use a condom or other form of protection to prevent STIs (sexually transmitted infections).  If you do not wish to become pregnant, use a form of birth control. If you plan to become pregnant, see your health care provider for a prepregnancy visit.  Find healthy ways to cope with stress, such as: ? Meditation, yoga, or listening to music. ? Journaling. ? Talking to a trusted person. ? Spending time with friends and family. Safety  Always wear your seat belt while driving or riding in a vehicle.  Do not drive: ? If you have been drinking alcohol. Do not ride with someone who has been drinking. ? When you are tired or distracted. ? While texting.  Wear a helmet and other protective equipment during  sports activities.  If you have firearms in your house, make sure you follow all gun safety procedures.  Seek help if you have been physically or sexually abused. What's next?  Go to your health care provider once a year for an annual wellness visit.  Ask your health care provider how often you should have your eyes and teeth checked.  Stay up to date on all vaccines. This information is not intended to replace advice given to you by your health care provider. Make sure you discuss any questions you have with your health care provider. Document Revised: 04/18/2020 Document Reviewed: 05/02/2018 Elsevier Patient Education  2021 [employer].

## 2020-10-01 LAB — COMPLETE METABOLIC PANEL WITH GFR
AG Ratio: 1.6 (calc) (ref 1.0–2.5)
ALT: 19 U/L (ref 6–29)
AST: 17 U/L (ref 10–30)
Albumin: 4.5 g/dL (ref 3.6–5.1)
Alkaline phosphatase (APISO): 80 U/L (ref 31–125)
BUN: 9 mg/dL (ref 7–25)
CO2: 25 mmol/L (ref 20–32)
Calcium: 9.6 mg/dL (ref 8.6–10.2)
Chloride: 105 mmol/L (ref 98–110)
Creat: 0.62 mg/dL (ref 0.50–1.10)
GFR, Est African American: 145 mL/min/{1.73_m2} (ref >=60)
GFR, Est Non African American: 125 mL/min/{1.73_m2} (ref >=60)
Globulin: 2.9 g/dL (calc) (ref 1.9–3.7)
Glucose, Bld: 81 mg/dL (ref 65–99)
Potassium: 4.2 mmol/L (ref 3.5–5.3)
Sodium: 141 mmol/L (ref 135–146)
Total Bilirubin: 0.5 mg/dL (ref 0.2–1.2)
Total Protein: 7.4 g/dL (ref 6.1–8.1)

## 2020-10-01 LAB — LIPID PANEL
Cholesterol: 202 mg/dL — ABNORMAL HIGH (ref <200)
HDL: 49 mg/dL — ABNORMAL LOW (ref >=50)
LDL Cholesterol (Calc): 136 mg/dL (calc) — ABNORMAL HIGH
Non-HDL Cholesterol (Calc): 153 mg/dL (calc) — ABNORMAL HIGH (ref <130)
Total CHOL/HDL Ratio: 4.1 (calc) (ref <5.0)
Triglycerides: 76 mg/dL (ref <150)

## 2020-10-01 LAB — TSH: TSH: 1.48 mIU/L

## 2020-10-01 LAB — HEMOGLOBIN A1C
Hgb A1c MFr Bld: 6.6 % of total Hgb — ABNORMAL HIGH (ref <5.7)
Mean Plasma Glucose: 143 mg/dL
eAG (mmol/L): 7.9 mmol/L

## 2020-10-01 LAB — CBC WITH DIFFERENTIAL/PLATELET
Absolute Monocytes: 581 cells/uL (ref 200–950)
Basophils Absolute: 33 cells/uL (ref 0–200)
Basophils Relative: 0.4 %
Eosinophils Absolute: 42 cells/uL (ref 15–500)
Eosinophils Relative: 0.5 %
MCH: 20.5 pg — ABNORMAL LOW (ref 27.0–33.0)
Neutro Abs: 5022 cells/uL (ref 1500–7800)
Neutrophils Relative %: 60.5 %
Platelets: 473 10*3/uL — ABNORMAL HIGH (ref 140–400)
RBC: 5.36 10*6/uL — ABNORMAL HIGH (ref 3.80–5.10)
RDW: 16.4 % — ABNORMAL HIGH (ref 11.0–15.0)
Total Lymphocyte: 31.6 %
WBC: 8.3 10*3/uL (ref 3.8–10.8)

## 2020-10-01 LAB — HEPATITIS C ANTIBODY
Hepatitis C Ab: NONREACTIVE
SIGNAL TO CUT-OFF: 0.01 (ref <1.00)

## 2020-10-01 LAB — IRON,TIBC AND FERRITIN PANEL
%SAT: 7 % (calc) — ABNORMAL LOW (ref 16–45)
Ferritin: 7 ng/mL — ABNORMAL LOW (ref 16–154)
Iron: 30 ug/dL — ABNORMAL LOW (ref 40–190)
TIBC: 444 mcg/dL (calc) (ref 250–450)

## 2020-10-04 ENCOUNTER — Telehealth: Payer: Self-pay

## 2020-10-04 NOTE — Telephone Encounter (Signed)
[facility] referring for Menorrhagia with irregular cycle. Called and left voicemail for patient to call back to be scheduled.

## 2020-10-05 NOTE — Telephone Encounter (Signed)
Called and left voicemail for patient to call back to be scheduled. 

## 2020-10-15 ENCOUNTER — Ambulatory Visit (INDEPENDENT_AMBULATORY_CARE_PROVIDER_SITE_OTHER): Payer: Self-pay | Admitting: Obstetrics and Gynecology

## 2020-10-15 ENCOUNTER — Encounter: Payer: Self-pay | Admitting: Obstetrics and Gynecology

## 2020-10-15 ENCOUNTER — Other Ambulatory Visit: Payer: Self-pay

## 2020-10-15 VITALS — BP 140/90 | Ht 64.0 in | Wt 364.0 lb

## 2020-10-15 DIAGNOSIS — R030 Elevated blood-pressure reading, without diagnosis of hypertension: Secondary | ICD-10-CM

## 2020-10-15 DIAGNOSIS — N921 Excessive and frequent menstruation with irregular cycle: Secondary | ICD-10-CM

## 2020-10-15 DIAGNOSIS — D509 Iron deficiency anemia, unspecified: Secondary | ICD-10-CM

## 2020-10-15 DIAGNOSIS — N939 Abnormal uterine and vaginal bleeding, unspecified: Secondary | ICD-10-CM

## 2020-10-15 DIAGNOSIS — D500 Iron deficiency anemia secondary to blood loss (chronic): Secondary | ICD-10-CM

## 2020-10-15 MED ORDER — NORETHINDRONE ACETATE 5 MG PO TABS: 5.0000 mg | ORAL_TABLET | Freq: Every day | ORAL | 0 refills | Status: DC

## 2020-10-17 LAB — VON WILLEBRAND PANEL
Factor VIII Activity: 240 % — ABNORMAL HIGH (ref 56–140)
Von Willebrand Ag: 160 % (ref 50–200)
Von Willebrand Factor: 117 % (ref 50–200)

## 2020-10-17 LAB — COAG STUDIES INTERP REPORT

## 2020-10-19 LAB — HGB FRACTIONATION CASCADE
Hgb A2: 2.1 % (ref 1.8–3.2)
Hgb A: 97.9 % (ref 96.4–98.8)
Hgb F: 0 % (ref 0.0–2.0)
Hgb S: 0 %

## 2020-10-21 NOTE — Progress Notes (Addendum)
Patient ID: JYRAH BLYE, female   DOB: [birth year], 25 y.o.   MRN: [id number]  Reason for Consult: Menstrual Problem (Cycles tend to be longer usual since Nov 2021 (14-25 days))   Referred by Danelle Berry, PA-C  Subjective:     HPI:  Emily Huang is a 25 y.o. female who present with concern for heavy menstrual bleeding and extended menstrual cycle length. Patient states she previously had regular menstrual cycles, occurring monthly, lasting 5-7 days with moderate flow. Patient reports that beginning in November, 2021 she noted an extended menstrual cycle with heavier flow. In December she experienced another extended cycle with heavy bleeding and clots that caused her to have to leave work. In January, 2022 she had a cycle that lasted a total of four weeks. Patient reports the first and second weeks was primarily light, pink spotting, the third week was heavy flow (at most, saturating pads in 15 minutes), and the fourth week being moderate to light flow. Patient denies bleeding today, but notes she has experienced spotting again this week and feels another cycle will be starting soon. Patient states she has never been sexually active. She does not use any form of contraception. This is her first visit for reproductive health.   Gynecological History  Menopause: n/a LMP: 09/09/20 Describes periods as heavy Last pap smear: n/a - patient has not had a pelvic exam previously Last Mammogram: n/a History of STDs: n/a Sexually Active: never  Obstetrical History [id number]  Past Medical History:  Diagnosis Date  . Anxiety    Family History  Problem Relation Age of Onset  . Anemia Mother   . Hyperlipidemia Mother   . Hypertension Mother   . Migraines Mother   . Cancer Mother 6       breast  . Anxiety disorder Mother   . Kidney disease Maternal Grandmother    History reviewed. No pertinent surgical history.  Short Social History:  Social History   Tobacco Use  . Smoking status:  Never Smoker  . Smokeless tobacco: Never Used  Substance Use Topics  . Alcohol use: Yes    Comment: everyother weekend    No Known Allergies  Current Outpatient Medications  Medication Sig Dispense Refill  . FLUoxetine (PROZAC) 10 MG tablet Take 1 tablet (10 mg total) by mouth daily. 90 tablet 3  . norethindrone (AYGESTIN) 5 MG tablet Take 1 tablet (5 mg total) by mouth daily. 30 tablet 0   No current facility-administered medications for this visit.    Review of Systems  Constitutional:  Constitutional negative. HENT: HENT negative.  Eyes: Eyes negative.  Respiratory: Respiratory negative.  Cardiovascular: Cardiovascular negative.  GI: Gastrointestinal negative.  GU:       Heavy, extended menstrual bleeding Musculoskeletal: Musculoskeletal negative.  Skin: Skin negative.  Neurological: Neurological negative. Hematologic: Hematologic/lymphatic negative.  Psychiatric: Positive for depressed mood.        Objective:  Objective   Vitals:   10/15/20 1527 10/15/20 1618  BP: (!) 158/100 140/90 (value rechecked)  Weight: (!) 364 lb (165.1 kg)   Height: 5\' 4"  (1.626 m)    Body mass index is 62.48 kg/m.  Physical Exam Constitutional:      General: She is not in acute distress.    Appearance: She is obese. She is not ill-appearing.  HENT:     Head: Normocephalic.  Cardiovascular:     Rate and Rhythm: Normal rate and regular rhythm.  Pulmonary:     Effort: Pulmonary effort is  normal.  Abdominal:     Palpations: Abdomen is soft.     Comments: obese  Genitourinary:    Comments: Patient declined pelvic exam today Musculoskeletal:        General: Normal range of motion.  Skin:    General: Skin is warm and dry.  Neurological:     Mental Status: She is alert and oriented to person, place, and time.  Psychiatric:        Mood and Affect: Mood normal.        Behavior: Behavior normal.     Assessment/Plan:     25 yo G0 presents with AUB-N. Reviewed labs from  09/30/20. Patient declined pelvic exam at today's visit. Plan made with patient to RTC for a TVUS and f/u after course of norethindrone for HMB. Recommended fe supplementation for anemia. Patient declined need for contraception or STI screening at today's visit.  Reviewed elevated BP values with patient. Recommended patient f/u with PCP for further evaluation.  Problem List Items Addressed This Visit      Other   Menorrhagia with irregular cycle - Primary   Relevant Medications   norethindrone (AYGESTIN) 5 MG tablet   Other Relevant Orders   US PELVIS (TRANSABDOMINAL ONLY)   Von Willebrand panel (Completed)   Iron deficiency anemia due to chronic blood loss   Relevant Orders   Hgb Fractionation [place] (Completed)    Other Visit Diagnoses    Abnormal uterine and vaginal bleeding, unspecified       Iron deficiency anemia, unspecified iron deficiency anemia type         RTC in 1 month for TVUS and gyn f/u.    Emily Huang, CNM [facility], [facility] 10/15/2020

## 2020-11-09 ENCOUNTER — Other Ambulatory Visit: Payer: Self-pay | Admitting: Obstetrics and Gynecology

## 2020-11-09 ENCOUNTER — Emergency Department
Admission: EM | Admit: 2020-11-09 | Discharge: 2020-11-09 | Disposition: A | Discharge status: Home / Self Care | Payer: 59 | Attending: Emergency Medicine | Admitting: Emergency Medicine

## 2020-11-09 ENCOUNTER — Other Ambulatory Visit: Payer: Self-pay

## 2020-11-09 ENCOUNTER — Telehealth: Payer: Self-pay

## 2020-11-09 ENCOUNTER — Emergency Department: Payer: 59

## 2020-11-09 DIAGNOSIS — R42 Dizziness and giddiness: Secondary | ICD-10-CM | POA: Insufficient documentation

## 2020-11-09 DIAGNOSIS — N939 Abnormal uterine and vaginal bleeding, unspecified: Secondary | ICD-10-CM | POA: Insufficient documentation

## 2020-11-09 DIAGNOSIS — N920 Excessive and frequent menstruation with regular cycle: Secondary | ICD-10-CM | POA: Diagnosis not present

## 2020-11-09 DIAGNOSIS — N921 Excessive and frequent menstruation with irregular cycle: Secondary | ICD-10-CM

## 2020-11-09 LAB — CBC
HCT: 30.6 % — ABNORMAL LOW (ref 36.0–46.0)
Hemoglobin: 8.7 g/dL — ABNORMAL LOW (ref 12.0–15.0)
MCH: 19.9 pg — ABNORMAL LOW (ref 26.0–34.0)
MCHC: 28.4 g/dL — ABNORMAL LOW (ref 30.0–36.0)
MCV: 70 fL — ABNORMAL LOW (ref 80.0–100.0)
Platelets: 485 10*3/uL — ABNORMAL HIGH (ref 150–400)
RBC: 4.37 MIL/uL (ref 3.87–5.11)
RDW: 17.9 % — ABNORMAL HIGH (ref 11.5–15.5)
WBC: 13.7 10*3/uL — ABNORMAL HIGH (ref 4.0–10.5)
nRBC: 0.7 % — ABNORMAL HIGH (ref 0.0–0.2)

## 2020-11-09 LAB — BASIC METABOLIC PANEL
Anion gap: 8 (ref 5–15)
BUN: 11 mg/dL (ref 6–20)
CO2: 24 mmol/L (ref 22–32)
Calcium: 9.1 mg/dL (ref 8.9–10.3)
Chloride: 106 mmol/L (ref 98–111)
Creatinine, Ser: 0.64 mg/dL (ref 0.44–1.00)
GFR, Estimated: >60 mL/min (ref >60)
Glucose, Bld: 140 mg/dL — ABNORMAL HIGH (ref 70–99)
Potassium: 4.7 mmol/L (ref 3.5–5.1)
Sodium: 138 mmol/L (ref 135–145)

## 2020-11-09 LAB — POC URINE PREG, ED: Preg Test, Ur: NEGATIVE

## 2020-11-09 LAB — HCG, QUANTITATIVE, PREGNANCY: hCG, Beta Chain, Quant, S: <1 m[IU]/mL (ref <5)

## 2020-11-09 MED ORDER — FERROUS SULFATE 325 (65 FE) MG PO TABS: 325.0000 mg | ORAL_TABLET | Freq: Every day | ORAL | 3 refills | Status: DC

## 2020-11-09 MED ORDER — MEDROXYPROGESTERONE ACETATE 10 MG PO TABS: ORAL_TABLET | ORAL | 0 refills | Status: DC

## 2020-11-09 NOTE — ED Notes (Signed)
Pt waiting on mother to stop by and bring her clean clothes.

## 2020-11-09 NOTE — ED Notes (Signed)
Pt remains off unit at imaging.

## 2020-11-09 NOTE — ED Notes (Signed)
Called lab to order hcg

## 2020-11-09 NOTE — ED Triage Notes (Signed)
Pt comes into the ED via EMS from home with c/o heavy vaginal bleeding with abd cramping for the past 8 days, states she has a hx of heavy periods. Today she is feeling light headed and dizzy.

## 2020-11-09 NOTE — ED Notes (Addendum)
See triage note. Pt c/o dizziness and SOB that started this morning. Denies interaction with anyone sick and reports has had covid shots and booster. Denies history of anemia or previously needing blood transfusions. Denies obvious bleeding besides vaginal. Denies N/V/D, fever, CP. History of anxiety. Denies major medical history.

## 2020-11-09 NOTE — Progress Notes (Signed)
Currently on norethindrone 5mg , will increase to Provera taper 20mg  po tid for heavier bleeding through the norethindrone.  Follow up 3/18 change to MD follow up

## 2020-11-09 NOTE — ED Provider Notes (Signed)
[facility] Emergency Department Provider Note   ____________________________________________   Event Date/Time   First MD Initiated Contact with Patient 11/09/20 [phone]     (approximate)  I have reviewed the triage vital signs and the nursing notes.   HISTORY  Chief Complaint Vaginal Bleeding    HPI Emily Huang is a 25 y.o. female with past medical history of menorrhagia and anemia who presents to the ED complaining of vaginal bleeding.  Patient reports that she has had consistent vaginal bleeding for the past 8 days that has been gradually worsening.  It initially started as light spotting but over the past 3 to 4 days has become much heavier, where she is passing palm-sized clots and having to change her pad almost every hour.  She denies any associated abdominal pain, vaginal discharge, or dysuria.  She states she has had heavy periods in the past, but has never required transfusion.  She became more concerned this morning when she noticed she was very lightheaded, but she denies any chest pain or shortness of breath.  She saw her OB/GYN at [facility] for this about 2 weeks ago, was started on norethindrone but does not currently take iron.        Past Medical History:  Diagnosis Date   Anxiety     Patient Active Problem List   Diagnosis Date Noted   Menorrhagia with irregular cycle 09/30/2020   Hyperlipidemia 09/30/2020   Current moderate episode of major depressive disorder (HCC) 09/30/2020   Family history of breast cancer in mother 09/30/2020   Iron deficiency anemia due to chronic blood loss 09/30/2020   Insulin resistance 01/15/2018   Morbid obesity (HCC) 01/07/2018   Social anxiety disorder 01/07/2018    History reviewed. No pertinent surgical history.  Prior to Admission medications   Medication Sig Start Date End Date Taking? Authorizing Provider  ferrous sulfate 325 (65 FE) MG tablet Take 1 tablet (325 mg total) by mouth  daily. 11/09/20 11/09/21 Yes Chesley Noon, MD  medroxyPROGESTERone (PROVERA) 10 MG tablet Take 2 tablets (20mg ) po tid x 7 days, then 2 tablets (20mg ) po once daily maintenance after the initial 7 days 11/09/20   , MD  FLUoxetine (PROZAC) 10 MG tablet Take 1 tablet (10 mg total) by mouth daily. 09/30/20   Vena Austria, PA-C    Allergies Patient has no known allergies.  Family History  Problem Relation Age of Onset   Anemia Mother    Hyperlipidemia Mother    Hypertension Mother    Migraines Mother    Cancer Mother 41       breast   Anxiety disorder Mother    Kidney disease Maternal Grandmother     Social History Social History   Tobacco Use   Smoking status: Never Smoker   Smokeless tobacco: Never Used  Danelle Berry Use: Never used  Substance Use Topics   Alcohol use: Yes    Comment: everyother weekend   Drug use: Not Currently    Review of Systems  Constitutional: No fever/chills.  Positive for lightheadedness. Eyes: No visual changes. ENT: No sore throat. Cardiovascular: Denies chest pain. Respiratory: Denies shortness of breath. Gastrointestinal: No abdominal pain.  No nausea, no vomiting.  No diarrhea.  No constipation. Genitourinary: Negative for dysuria.  Positive for vaginal bleeding. Musculoskeletal: Negative for back pain. Skin: Negative for rash. Neurological: Negative for headaches, focal weakness or numbness.  ____________________________________________   PHYSICAL EXAM:  VITAL SIGNS: ED Triage  Vitals [11/09/20 0737]  Enc Vitals Group     BP (!) 140/117     Pulse Rate (!) 107     Resp 18     Temp 98.8 F (37.1 C)     Temp Source Oral     SpO2 99 %     Weight (!) 350 lb (158.8 kg)     Height 5\' 3"  (1.6 m)     Head Circumference      Peak Flow      Pain Score 6     Pain Loc      Pain Edu?      Excl. in GC?     Constitutional: Alert and oriented. Eyes: Conjunctivae are normal. Head: Atraumatic. Nose: No  congestion/rhinnorhea. Mouth/Throat: Mucous membranes are moist. Neck: Normal ROM Cardiovascular: Normal rate, regular rhythm. Grossly normal heart sounds. Respiratory: Normal respiratory effort.  No retractions. Lungs CTAB. Gastrointestinal: Soft and nontender. No distention. Genitourinary: Moderate amount of blood in the vaginal vault, no cervical motion adnexal tenderness. Musculoskeletal: No lower extremity tenderness nor edema. Neurologic:  Normal speech and language. No gross focal neurologic deficits are appreciated. Skin:  Skin is warm, dry and intact. No rash noted. Psychiatric: Mood and affect are normal. Speech and behavior are normal.  ____________________________________________   LABS (all labs ordered are listed, but only abnormal results are displayed)  Labs Reviewed  BASIC METABOLIC PANEL - Abnormal; Notable for the following components:      Result Value   Glucose, Bld 140 (*)    All other components within normal limits  CBC - Abnormal; Notable for the following components:   WBC 13.7 (*)    Hemoglobin 8.7 (*)    HCT 30.6 (*)    MCV 70.0 (*)    MCH 19.9 (*)    MCHC 28.4 (*)    RDW 17.9 (*)    Platelets 485 (*)    nRBC 0.7 (*)    All other components within normal limits  HCG, QUANTITATIVE, PREGNANCY  POC URINE PREG, ED    PROCEDURES  Procedure(s) performed (including Critical Care):  Procedures   ____________________________________________   INITIAL IMPRESSION / ASSESSMENT AND PLAN / ED COURSE       25 year old female with past medical history of anemia and menorrhagia who presents to the ED with gradually worsening vaginal bleeding over the past 8 days, now has ongoing bleeding with lightheadedness.  Hemoglobin does show drop from previous, was 11 about 1 month ago and today is 8.7.  Given she is hemodynamically stable with hemoglobin of greater than seven, there is no dictation for transfusion at this time.  Remainder of labs are unremarkable,  plan to discuss options with OB/GYN.  Ultrasound was attempted, however patient is not sexually active and therefore transvaginal imaging not indicated.  Given her morbid obesity, transabdominal images would be severely limited and likely not informative.  Case discussed with Dr. 22 of OB/GYN, who agrees with plan to hold off on ultrasound and will prescribe Provera taper to help control bleeding.  Patient was also prescribed iron supplementation and is appropriate for discharge home with close OB/GYN follow-up.  She was counseled to return to the ED for new worsening symptoms, patient and mother agree with plan.      ____________________________________________   FINAL CLINICAL IMPRESSION(S) / ED DIAGNOSES  Final diagnoses:  Vagina bleeding  Menorrhagia with irregular cycle     ED Discharge Orders         Ordered  ferrous sulfate 325 (65 FE) MG tablet  Daily        11/09/20 1146           Note:  This document was prepared using Dragon voice recognition software and may include unintentional dictation errors.   Chesley Noon, MD 11/09/20 1147

## 2020-11-09 NOTE — ED Notes (Signed)
Per security staff, pt left without signing for d/c while this RN was assisting in another pt's room.

## 2020-11-09 NOTE — Telephone Encounter (Signed)
Patient is scheduled for 11/18/20 at [facility] for ultrasound and 11/22/20 at 4:30 with [facility]

## 2020-11-09 NOTE — Telephone Encounter (Signed)
-----   Message from Emily Austria, MD sent at 11/09/2020 11:13 AM EST ----- Regarding: Follow up She has a follow up ultrasound in 10 day she was seen in the ER 3/8.  She's seeing Jae Dire can we change her follow up to be with one of the MD that day

## 2020-11-17 ENCOUNTER — Other Ambulatory Visit: Payer: Self-pay | Admitting: Obstetrics and Gynecology

## 2020-11-17 DIAGNOSIS — N921 Excessive and frequent menstruation with irregular cycle: Secondary | ICD-10-CM

## 2020-11-18 ENCOUNTER — Ambulatory Visit
Admission: RE | Admit: 2020-11-18 | Discharge: 2020-11-18 | Disposition: A | Discharge status: Home / Self Care | Payer: PRIVATE HEALTH INSURANCE | Source: Ambulatory Visit | Attending: Obstetrics and Gynecology | Admitting: Obstetrics and Gynecology

## 2020-11-18 DIAGNOSIS — N921 Excessive and frequent menstruation with irregular cycle: Secondary | ICD-10-CM

## 2020-11-19 ENCOUNTER — Ambulatory Visit: Payer: 59 | Admitting: Obstetrics and Gynecology

## 2020-11-19 ENCOUNTER — Ambulatory Visit: Payer: 59

## 2020-11-19 DIAGNOSIS — N921 Excessive and frequent menstruation with irregular cycle: Secondary | ICD-10-CM

## 2020-11-22 ENCOUNTER — Other Ambulatory Visit: Payer: Self-pay

## 2020-11-22 ENCOUNTER — Ambulatory Visit (INDEPENDENT_AMBULATORY_CARE_PROVIDER_SITE_OTHER): Payer: 59 | Admitting: Obstetrics and Gynecology

## 2020-11-22 ENCOUNTER — Encounter: Payer: Self-pay | Admitting: Obstetrics and Gynecology

## 2020-11-22 VITALS — BP 124/80 | Ht 64.0 in | Wt 262.0 lb

## 2020-11-22 DIAGNOSIS — N938 Other specified abnormal uterine and vaginal bleeding: Secondary | ICD-10-CM

## 2020-11-22 DIAGNOSIS — N939 Abnormal uterine and vaginal bleeding, unspecified: Secondary | ICD-10-CM

## 2020-11-22 NOTE — Progress Notes (Signed)
Obstetrics & Gynecology Office Visit   Chief Complaint:  Chief Complaint  Patient presents with  . Follow-up    F/U TVUS - RM 5    History of Present Illness: 25 y.o. No obstetric history on file. presenting for medication follow up for a diagnosis of AUB.  She is currently being managed with provera.   The patient reports improvement in symptoms but continued bleeding albeit lighter.  On her current medication regimen has intermittent breakthrough bleeding.   She has not noted any side-effects or new symptoms.    Review of Systems: Review of Systems  Constitutional: Negative.   Gastrointestinal: Negative.   Genitourinary: Negative.   Neurological: Negative for headaches.  Endo/Heme/Allergies: Does not bruise/bleed easily.     Past Medical History:  Past Medical History:  Diagnosis Date  . Anxiety     Past Surgical History:  History reviewed. No pertinent surgical history.  Gynecologic History: Patient's last menstrual period was 11/02/2020 (exact date).  Obstetric History: No obstetric history on file.  Family History:  Family History  Problem Relation Age of Onset  . Anemia Mother   . Hyperlipidemia Mother   . Hypertension Mother   . Migraines Mother   . Cancer Mother 55       breast  . Anxiety disorder Mother   . Kidney disease Maternal Grandmother     Social History:  Social History   Socioeconomic History  . Marital status: Single    Spouse name: Not on file  . Number of children: Not on file  . Years of education: Not on file  . Highest education level: Not on file  Occupational History  . Not on file  Tobacco Use  . Smoking status: Never Smoker  . Smokeless tobacco: Never Used  Vaping Use  . Vaping Use: Never used  Substance and Sexual Activity  . Alcohol use: Yes    Comment: everyother weekend  . Drug use: Not Currently  . Sexual activity: Never    Birth control/protection: Abstinence  Other Topics Concern  . Not on file  Social  History Narrative  . Not on file   Social Determinants of Health   Financial Resource Strain: Medium Risk  . Difficulty of Paying Living Expenses: Somewhat hard  Food Insecurity: Not on file  Transportation Needs: Unmet Transportation Needs  . Lack of Transportation (Medical): Yes  . Lack of Transportation (Non-Medical): Yes  Physical Activity: Inactive  . Days of Exercise per Week: 0 days  . Minutes of Exercise per Session: 0 min  Stress: Stress Concern Present  . Feeling of Stress : Very much  Social Connections: Unknown  . Frequency of Communication with Friends and Family: Once a week  . Frequency of Social Gatherings with Friends and Family: Once a week  . Attends Religious Services: 1 to 4 times per year  . Active Member of Clubs or Organizations: No  . Attends Banker Meetings: Never  . Marital Status: Not on file  Intimate Partner Violence: Not on file    Allergies:  No Known Allergies  Medications: Prior to Admission medications   Medication Sig Start Date End Date Taking? Authorizing Provider  ferrous sulfate 325 (65 FE) MG tablet Take 1 tablet (325 mg total) by mouth daily. 11/09/20 11/09/21 Yes Chesley Noon, MD  FLUoxetine (PROZAC) 10 MG tablet Take 1 tablet (10 mg total) by mouth daily. 09/30/20  Yes Danelle Berry, PA-C  medroxyPROGESTERone (PROVERA) 10 MG tablet Take 2 tablets (  20mg ) po tid x 7 days, then 2 tablets (20mg ) po once daily maintenance after the initial 7 days 11/09/20  Yes , MD    Physical Exam Vitals:  Vitals:   11/22/20 1633  BP: 124/80   Patient's last menstrual period was 11/02/2020 (exact date).  General: NAD HEENT: normocephalic, anicteric Pulmonary: No increased work of breathing Extremities: no edema, erythema, or tenderness Neurologic: Grossly intact Psychiatric: mood appropriate, affect full  Female chaperone present for pelvic  portions of the physical exam  No results found.   Assessment: 25 y.o.  follow up AUB  Plan: Problem List Items Addressed This Visit   None   Visit Diagnoses    Abnormal uterine bleeding    -  Primary      1) AUB - has had continued bleeding despite high dose provera.  Suspect AUB-O.  No intested in conceiving in the near future.  ER ultrasound 11/09/2020 was attempted abdominal approach non-diagnostic secondary to patient habitus and patient not sexually active did not tolerate transvaginal approach -Post for hysteroscopy, D&C, and Mirena IUD insertion (request sent orders placed)  2) A total of 15 minutes were spent in face-to-face contact with the patient during this encounter with over half of that time devoted to counseling and coordination of care.    30, MD, 01/09/2021 OB/GYN, [facility] 11/22/2020, 5:05 PM

## 2020-11-24 ENCOUNTER — Encounter: Payer: Self-pay | Admitting: Family Medicine

## 2020-11-24 ENCOUNTER — Other Ambulatory Visit: Payer: Self-pay

## 2020-11-24 ENCOUNTER — Ambulatory Visit (INDEPENDENT_AMBULATORY_CARE_PROVIDER_SITE_OTHER): Payer: 59 | Admitting: Family Medicine

## 2020-11-24 VITALS — BP 122/80 | HR 112 | Temp 98.7°F | Resp 16 | Ht 64.0 in | Wt 360.5 lb

## 2020-11-24 DIAGNOSIS — D500 Iron deficiency anemia secondary to blood loss (chronic): Secondary | ICD-10-CM | POA: Diagnosis not present

## 2020-11-24 DIAGNOSIS — N921 Excessive and frequent menstruation with irregular cycle: Secondary | ICD-10-CM

## 2020-11-24 NOTE — Patient Instructions (Signed)
It was great to see you!  Our plans for today:  - Keep taking your provera and iron supplements. - We are rechecking your labs today, we will call you with these results or release them to your MyChart. - Keep your follow up appointments with the gynecologist.  Take care and seek immediate care sooner if you develop any concerns.   Dr. Linwood Dibbles

## 2020-11-24 NOTE — Assessment & Plan Note (Signed)
With significant fatigue and iron deficiency. Will recheck labs today. Maintain f/u with GYN for D&C and mirena placement.

## 2020-11-24 NOTE — Progress Notes (Signed)
   SUBJECTIVE:   CHIEF COMPLAINT / HPI:   FATIGUE - recently seen by GYN for AUB, planning for hysteroscopy, D&C and mirena placement. Awaiting call for scheduling. - Hb 8.7 11/09/20 in ED, prescribed provera taper and iron supplementation. - bleeding initially slowed on taper but came back once switching to 2 pills daily. Still on taper.  - no FH of sickle cell disease or clotting d/o.   Duration:  months  Onset: gradual Symptoms improve with rest: yes  Depressive symptoms: no Stress/anxiety: history of but well controlled on prozac Insomnia: no Snoring: no Observed apnea by bed partner: sleeps alone Wakes feeling refreshed: sometimes History of sleep study: no Dysnea on exertion:  yes Orthopnea/PND: no Chest pain: no Chronic cough: no Lower extremity edema: no Arthralgias:no Rash: no   OBJECTIVE:   BP 122/80   Pulse (!) 112   Temp 98.7 F (37.1 C) (Oral)   Resp 16   Ht 5\' 4"  (1.626 m)   Wt (!) 360 lb 8 oz (163.5 kg)   LMP 11/02/2020 (Exact Date)   SpO2 99%   BMI 61.88 kg/m   Gen: well appearing, obese, in NAD Card: reg rhythm, tachycardic Lungs: CTAB Ext: WWP, no edema   ASSESSMENT/PLAN:   Menorrhagia with irregular cycle With significant fatigue and iron deficiency. Will recheck labs today. Maintain f/u with GYN for D&C and mirena placement.     01/02/2021, DO

## 2020-11-25 LAB — FERRITIN: Ferritin: 8 ng/mL — ABNORMAL LOW (ref 16–154)

## 2020-11-25 LAB — FOLATE: Folate: 15.1 ng/mL

## 2020-11-25 LAB — VITAMIN B12: Vitamin B-12: 388 pg/mL (ref 200–1100)

## 2020-11-25 LAB — IRON, TOTAL/TOTAL IRON BINDING CAP
%SAT: 10 % (calc) — ABNORMAL LOW (ref 16–45)
Iron: 45 ug/dL (ref 40–190)
TIBC: 448 mcg/dL (calc) (ref 250–450)

## 2020-11-29 ENCOUNTER — Telehealth: Payer: 59 | Admitting: Family Medicine

## 2020-12-06 ENCOUNTER — Telehealth: Payer: Self-pay

## 2020-12-06 NOTE — Telephone Encounter (Signed)
LVM for pt to rtn call 

## 2020-12-06 NOTE — Telephone Encounter (Signed)
-----   Message from Vena Austria, MD sent at 11/22/2020  5:05 PM EDT ----- Regarding: surgery Surgery Booking Request Patient Full Name:  Emily Huang  MRN: [id number]  DOB: July 01, 1996  Surgeon: Vena Austria, MD  Requested Surgery Date and Time: 2-5 weeks Primary Diagnosis AND Code: AUB N93.9 Secondary Diagnosis and Code:  Surgical Procedure: Hysteroscopy D&C, Mirena IUD insertion RNFA Requested?: No L&D Notification: No Admission Status: same day surgery Length of Surgery: 50 min Special Case Needs: yes Mirena  H&P: No Phone Interview???:  Yes Interpreter: No Medical Clearance:  No Special Scheduling Instructions: No Any known health/anesthesia issues, diabetes, sleep apnea, latex allergy, defibrillator/pacemaker?: no Acuity: P2   (P1 highest, P2 delay may cause harm, P3 low, elective gyn, P4 lowest)

## 2020-12-09 ENCOUNTER — Telehealth: Payer: 59 | Admitting: Family Medicine

## 2020-12-27 NOTE — Telephone Encounter (Signed)
Left a message for the patient to return the call.  

## 2021-01-26 ENCOUNTER — Telehealth: Payer: Self-pay

## 2021-01-26 NOTE — Telephone Encounter (Signed)
5/25 - LVM for pt to rtn call

## 2021-01-26 NOTE — Telephone Encounter (Signed)
Left a message for the patient to return the call.  

## 2022-11-21 ENCOUNTER — Telehealth: Payer: Self-pay

## 2022-11-21 NOTE — Transitions of Care (Post Inpatient/ED Visit) (Unsigned)
   11/21/2022  Name: Emily Huang MRN: [id number] DOB: 1996/04/20  Today's TOC FU Call Status: Today's TOC FU Call Status:: Unsuccessul Call (1st Attempt) Unsuccessful Call (1st Attempt) Date: 11/21/22  Attempted to reach the patient regarding the most recent Inpatient/ED visit.  Follow Up Plan: Additional outreach attempts will be made to reach the patient to complete the Transitions of Care (Post Inpatient/ED visit) call.   Signature Juanda Crumble, [place] Direct Dial [phone]

## 2022-11-21 NOTE — Telephone Encounter (Signed)
Patient was returning the call from [facility]. Patient stated any time is good to call back on her mobile [phone]

## 2022-11-22 NOTE — Transitions of Care (Post Inpatient/ED Visit) (Signed)
   11/22/2022  Name: Emily Huang MRN: [id number] DOB: [birth year]  Today's TOC FU Call Status: Today's TOC FU Call Status:: Successful TOC FU Call Competed Unsuccessful Call (1st Attempt) Date: 11/21/22 [facility] FU Call Complete Date: 11/22/22  Transition Care Management Follow-up Telephone Call Date of Discharge: 11/20/22 Discharge Facility: Other ([place]) Name of Other (Non-[facility]) Discharge Facility: [facility] Type of Discharge: Inpatient Admission Primary Inpatient Discharge Diagnosis:: depression How have you been since you were released from the hospital?: Better Any questions or concerns?: No  Items Reviewed: Did you receive and understand the discharge instructions provided?: Yes Medications obtained and verified?: Yes (Medications Reviewed) Any new allergies since your discharge?: No Dietary orders reviewed?: NA Do you have support at home?: No  Home Care and Equipment/Supplies: [facility] Ordered?: NA Any new equipment or medical supplies ordered?: NA  Functional Questionnaire: Do you need assistance with bathing/showering or dressing?: No Do you need assistance with meal preparation?: No Do you need assistance with eating?: No Do you have difficulty maintaining continence: No Do you need assistance with getting out of bed/getting out of a chair/moving?: No Do you have difficulty managing or taking your medications?: No  Follow up appointments reviewed: PCP Follow-up appointment confirmed?: [facility] Follow-up appointment confirmed?: Yes Date of Specialist follow-up appointment?: 11/24/22 Follow-Up Specialty Provider:: [place] Do you need transportation to your follow-up appointment?: No Do you understand care options if your condition(s) worsen?: Yes-patient verbalized understanding    [place], [place] Nurse Health Advisor Direct Dial [phone]

## 2023-01-05 ENCOUNTER — Encounter: Payer: Self-pay | Admitting: Family Medicine

## 2023-01-05 ENCOUNTER — Ambulatory Visit (INDEPENDENT_AMBULATORY_CARE_PROVIDER_SITE_OTHER): Payer: Medicaid Other | Admitting: Family Medicine

## 2023-01-05 VITALS — BP 132/84 | HR 88 | Temp 97.7°F | Resp 16 | Ht 63.0 in | Wt 360.1 lb

## 2023-01-05 DIAGNOSIS — Z0000 Encounter for general adult medical examination without abnormal findings: Secondary | ICD-10-CM

## 2023-01-05 DIAGNOSIS — F331 Major depressive disorder, recurrent, moderate: Secondary | ICD-10-CM

## 2023-01-05 DIAGNOSIS — R7303 Prediabetes: Secondary | ICD-10-CM

## 2023-01-05 DIAGNOSIS — F4010 Social phobia, unspecified: Secondary | ICD-10-CM

## 2023-01-05 DIAGNOSIS — D500 Iron deficiency anemia secondary to blood loss (chronic): Secondary | ICD-10-CM

## 2023-01-05 DIAGNOSIS — Z124 Encounter for screening for malignant neoplasm of cervix: Secondary | ICD-10-CM

## 2023-01-05 MED ORDER — FERROUS SULFATE 325 (65 FE) MG PO TABS: 325.0000 mg | ORAL_TABLET | Freq: Every day | ORAL | 3 refills | Status: AC

## 2023-01-05 NOTE — Patient Instructions (Signed)

## 2023-01-05 NOTE — Progress Notes (Signed)
Patient: Emily Huang, Female    DOB: 03/06/1996, 27 y.o.   MRN: [id number] Danelle Berry, PA-C Visit Date: 01/05/2023  Today's Provider: Danelle Berry, PA-C   Chief Complaint  Patient presents with   Annual Exam   Subjective:   Annual physical exam:  Emily Huang is a 27 y.o. female who presents today for complete physical exam:  Exercise/Activity:  just walking at work at [employer] Diet/nutrition:   "eats normal" try to cook something like buscuits and eggs, lunch goes out to eat restaurants, don't eat dinner  Sleep: good sleep  SDOH Screenings   Food Insecurity: No Food Insecurity (01/05/2023)  Housing: Low Risk  (01/05/2023)  Transportation Needs: No Transportation Needs (01/05/2023)  Utilities: Not At Risk (01/05/2023)  Alcohol Screen: Low Risk  (01/05/2023)  Depression (PHQ2-9): Low Risk  (01/05/2023)  Financial Resource Strain: Low Risk  (01/05/2023)  Physical Activity: Inactive (01/05/2023)  Social Connections: Socially Isolated (01/05/2023)  Stress: Stress Concern Present (01/05/2023)  Tobacco Use: Low Risk  (01/05/2023)     USPSTF grade A and B recommendations - reviewed and addressed today  Depression:  Phq 9 completed today by patient, was reviewed by me with patient in the room PHQ score is neg, pt feels good on current meds, managed by psychiatry at [facility]    01/05/2023    9:39 AM 11/24/2020   10:40 AM 09/30/2020   10:42 AM 01/07/2018    2:06 PM  PHQ 2/9 Scores  PHQ - 2 Score 0 0 4 0  PHQ- 9 Score 0  11       01/05/2023    9:39 AM 11/24/2020   10:40 AM 09/30/2020   10:42 AM 01/07/2018    2:06 PM  Depression screen PHQ 2/9  Decreased Interest 0 0 1 0  Down, Depressed, Hopeless 0 0 3 0  PHQ - 2 Score 0 0 4 0  Altered sleeping 0  2   Tired, decreased energy 0  1   Change in appetite 0  0   Feeling bad or failure about yourself  0  3   Trouble concentrating 0  0   Moving slowly or fidgety/restless 0  1   Suicidal thoughts 0  0   PHQ-9 Score 0  11   Difficult doing  work/chores Not difficult at all  Somewhat difficult     Alcohol screening: Flowsheet Row Office Visit from 01/05/2023 in [facility]  AUDIT-C Score 0       Immunizations and Health Maintenance: Health Maintenance  Topic Date Due   PAP SMEAR-Modifier  01/05/2023 (Originally 11/17/2015)   COVID-19 Vaccine (4 - 2023-24 season) 01/21/2023 (Originally 05/05/2022)   PAP-Cervical Cytology Screening  01/05/2024 (Originally 11/17/2015)   INFLUENZA VACCINE  04/05/2023   DTaP/Tdap/Td (2 - Td or Tdap) 01/08/2028   Hepatitis C Screening  Completed   HIV Screening  Completed   HPV VACCINES  Aged Out     Hep C Screening: done  STD testing and prevention (HIV/chl/gon/syphilis):  see above, no additional testing desired by pt today  Intimate partner violence:     Sexual History/Pain during Intercourse: Single  Menstrual History/LMP/Abnormal Bleeding:  Cycles - regular around the end of the month Lasts 5-6 days, heavy day is on 2nd day  IDA    Patient's last menstrual period was 12/29/2022 (approximate).  Incontinence Symptoms: none  Breast cancer: not due per age   Cervical cancer screening: declines, never sexually active, due for  pap Pt denies family hx of cancers - breast, ovarian, uterine, colon:     Osteoporosis:   Discussion on osteoporosis per age, including high calcium and vitamin D supplementation, weight bearing exercises  Skin cancer:  Hx of skin CA -  NO Discussed atypical lesions   Colorectal cancer:   Colonoscopy is not due per age   Discussed concerning signs and sx of CRC  Lung cancer:   Low Dose CT Chest recommended if Age 62-80 years, 20 pack-year currently smoking OR have quit w/in 15years. Patient does not qualify.    Social History   Tobacco Use   Smoking status: Never   Smokeless tobacco: Never  Vaping Use   Vaping Use: Never used  Substance Use Topics   Alcohol use: Not Currently    Comment: everyother weekend   Drug  use: Never     Flowsheet Row Office Visit from 01/05/2023 in [facility]  AUDIT-C Score 0       Family History  Problem Relation Age of Onset   Anemia Mother    Hyperlipidemia Mother    Hypertension Mother    Migraines Mother    Cancer Mother 40       breast   Anxiety disorder Mother    Kidney disease Maternal Grandmother      Blood pressure/Hypertension: BP Readings from Last 3 Encounters:  01/05/23 132/84  11/24/20 122/80  11/22/20 124/80    Weight/Obesity: Wt Readings from Last 3 Encounters:  01/05/23 (!) 360 lb 1.6 oz (163.3 kg)  11/24/20 (!) 360 lb 8 oz (163.5 kg)  11/22/20 262 lb (118.8 kg)   BMI Readings from Last 3 Encounters:  01/05/23 63.79 kg/m  11/24/20 61.88 kg/m  11/22/20 44.97 kg/m     Lipids:  Lab Results  Component Value Date   CHOL 202 (H) 09/30/2020   CHOL 212 (H) 01/07/2018   Lab Results  Component Value Date   HDL 49 (L) 09/30/2020   HDL 59 01/07/2018   Lab Results  Component Value Date   LDLCALC 136 (H) 09/30/2020   LDLCALC 121 (H) 01/07/2018   Lab Results  Component Value Date   TRIG 76 09/30/2020   TRIG 206 (H) 01/07/2018   Lab Results  Component Value Date   CHOLHDL 4.1 09/30/2020   CHOLHDL 3.6 01/07/2018   No results found for: "LDLDIRECT" Based on the results of lipid panel his/her cardiovascular risk factor ( using [place] Cohort )  in the next 10 years is: The ASCVD Risk score (Arnett DK, et al., 2019) failed to calculate for the following reasons:   The 2019 ASCVD risk score is only valid for ages 67 to 58  Glucose:  Glucose, Bld  Date Value Ref Range Status  11/09/2020 140 (H) 70 - 99 mg/dL Final    Comment:    Glucose reference range applies only to samples taken after fasting for at least 8 hours.  09/30/2020 81 65 - 99 mg/dL Final    Comment:    .            Fasting reference interval .   01/07/2018 74 65 - 99 mg/dL Final    Comment:    .            Fasting reference  interval .     Advanced Care Planning:  A voluntary discussion about advance care planning including the explanation and discussion of advance directives.     Social History  Social History   Socioeconomic History   Marital status: Single    Spouse name: Not on file   Number of children: Not on file   Years of education: Not on file   Highest education level: Not on file  Occupational History   Not on file  Tobacco Use   Smoking status: Never   Smokeless tobacco: Never  Vaping Use   Vaping Use: Never used  Substance and Sexual Activity   Alcohol use: Not Currently    Comment: everyother weekend   Drug use: Never   Sexual activity: Never    Birth control/protection: Abstinence  Other Topics Concern   Not on file  Social History Narrative   Not on file   Social Determinants of Health   Financial Resource Strain: Low Risk  (01/05/2023)   Overall Financial Resource Strain (CARDIA)    Difficulty of Paying Living Expenses: Not hard at all  Food Insecurity: No Food Insecurity (01/05/2023)   Hunger Vital Sign    Worried About Running Out of Food in the Last Year: Never true    Ran Out of Food in the Last Year: Never true  Transportation Needs: No Transportation Needs (01/05/2023)   PRAPARE - Administrator, Civil Service (Medical): No    Lack of Transportation (Non-Medical): No  Physical Activity: Inactive (01/05/2023)   Exercise Vital Sign    Days of Exercise per Week: 0 days    Minutes of Exercise per Session: 0 min  Stress: Stress Concern Present (01/05/2023)   [employer] of Occupational Health - Occupational Stress Questionnaire    Feeling of Stress : To some extent  Social Connections: Socially Isolated (01/05/2023)   Social Connection and Isolation Panel [NHANES]    Frequency of Communication with Friends and Family: More than three times a week    Frequency of Social Gatherings with Friends and Family: More than three times a week    Attends  Religious Services: Never    Database administrator or Organizations: No    Attends Engineer, structural: Never    Marital Status: Never married    Family History        Family History  Problem Relation Age of Onset   Anemia Mother    Hyperlipidemia Mother    Hypertension Mother    Migraines Mother    Cancer Mother 11       breast   Anxiety disorder Mother    Kidney disease Maternal Grandmother     Patient Active Problem List   Diagnosis Date Noted   Menorrhagia with irregular cycle 09/30/2020   Hyperlipidemia 09/30/2020   Current moderate episode of major depressive disorder (HCC) 09/30/2020   Family history of breast cancer in mother 09/30/2020   Iron deficiency anemia due to chronic blood loss 09/30/2020   Insulin resistance 01/15/2018   Morbid obesity (HCC) 01/07/2018   Social anxiety disorder 01/07/2018    History reviewed. No pertinent surgical history.   Current Outpatient Medications:    hydrOXYzine (ATARAX) 25 MG tablet, Take 25 mg by mouth daily., Disp: , Rfl:    sertraline (ZOLOFT) 50 MG tablet, Take 50 mg by mouth daily., Disp: , Rfl:    ferrous sulfate 325 (65 FE) MG tablet, Take 1 tablet (325 mg total) by mouth daily., Disp: 30 tablet, Rfl: 3   FLUoxetine (PROZAC) 10 MG tablet, Take 1 tablet (10 mg total) by mouth daily. (Patient not taking: Reported on 01/05/2023), Disp: 90 tablet, Rfl: 3  medroxyPROGESTERone (PROVERA) 10 MG tablet, Take 2 tablets (20mg ) po tid x 7 days, then 2 tablets (20mg ) po once daily maintenance after the initial 7 days (Patient not taking: Reported on 01/05/2023), Disp: 84 tablet, Rfl: 0  No Known Allergies  Patient Care Team: Danelle Berry, PA-C as PCP - General (Family Medicine)   Chart Review: I personally reviewed active problem list, medication list, allergies, family history, social history, health maintenance, notes from last encounter, lab results, imaging with the patient/caregiver today.   Review of Systems   Constitutional: Negative.   HENT: Negative.    Eyes: Negative.   Respiratory: Negative.    Cardiovascular: Negative.   Gastrointestinal: Negative.   Endocrine: Negative.   Genitourinary: Negative.   Musculoskeletal: Negative.   Skin: Negative.   Allergic/Immunologic: Negative.   Neurological: Negative.   Hematological: Negative.   Psychiatric/Behavioral: Negative.    All other systems reviewed and are negative.         Objective:   Vitals:  Vitals:   01/05/23 0949  BP: 132/84  Pulse: 88  Resp: 16  Temp: 97.7 F (36.5 C)  TempSrc: Oral  SpO2: 98%  Weight: (!) 360 lb 1.6 oz (163.3 kg)  Height: 5\' 3"  (1.6 m)    Body mass index is 63.79 kg/m.  Physical Exam Vitals and nursing note reviewed.  Constitutional:      General: She is not in acute distress.    Appearance: Normal appearance. She is well-developed. She is obese. She is not ill-appearing, toxic-appearing or diaphoretic.     Interventions: Face mask in place.  HENT:     Head: Normocephalic and atraumatic.     Right Ear: External ear normal.     Left Ear: External ear normal.     Nose: Nose normal.     Mouth/Throat:     Mouth: Mucous membranes are moist.     Pharynx: Oropharynx is clear. No oropharyngeal exudate or posterior oropharyngeal erythema.  Eyes:     General: Lids are normal. No scleral icterus.       Right eye: No discharge.        Left eye: No discharge.     Conjunctiva/sclera: Conjunctivae normal.  Neck:     Trachea: Phonation normal. No tracheal deviation.  Cardiovascular:     Rate and Rhythm: Normal rate and regular rhythm.     Pulses: Normal pulses.          Radial pulses are 2+ on the right side and 2+ on the left side.       Posterior tibial pulses are 2+ on the right side and 2+ on the left side.     Heart sounds: Normal heart sounds. No murmur heard.    No friction rub. No gallop.  Pulmonary:     Effort: Pulmonary effort is normal. No respiratory distress.     Breath sounds:  Normal breath sounds. No stridor. No wheezing, rhonchi or rales.  Chest:     Chest wall: No tenderness.  Abdominal:     General: Bowel sounds are normal. There is no distension.     Palpations: Abdomen is soft.     Comments: Obese soft, NBS x 4  Musculoskeletal:     Right lower leg: No edema.     Left lower leg: No edema.  Skin:    General: Skin is warm and dry.     Coloration: Skin is not jaundiced or pale.     Findings: No rash.  Neurological:  Mental Status: She is alert. Mental status is at baseline.     Motor: No abnormal muscle tone.     Gait: Gait normal.  Psychiatric:        Mood and Affect: Mood normal.        Speech: Speech normal.        Behavior: Behavior normal.       Fall Risk:    01/05/2023    9:38 AM 11/24/2020   10:32 AM 09/30/2020   10:41 AM 01/07/2018    2:06 PM  Fall Risk   Falls in the past year? 0 0 0 No  Number falls in past yr: 0 0 0   Injury with Fall? 0 0 0   Risk for fall due to : No Fall Risks     Follow up Falls prevention discussed;Education provided;Falls evaluation completed       Functional Status Survey: Is the patient deaf or have difficulty hearing?: No Does the patient have difficulty seeing, even when wearing glasses/contacts?: No Does the patient have difficulty concentrating, remembering, or making decisions?: Yes Does the patient have difficulty walking or climbing stairs?: No Does the patient have difficulty dressing or bathing?: No Does the patient have difficulty doing errands alone such as visiting a doctor's office or shopping?: No   Assessment & Plan:    CPE completed today  USPSTF grade A and B recommendations reviewed with patient; age-appropriate recommendations, preventive care, screening tests, etc discussed and encouraged; healthy living encouraged; see AVS for patient education given to patient  Discussed importance of 150 minutes of physical activity weekly, AHA exercise recommendations given to pt in  AVS/handout  Discussed importance of healthy diet:  eating lean meats and proteins, avoiding trans fats and saturated fats, avoid simple sugars and excessive carbs in diet, eat 6 servings of fruit/vegetables daily and drink plenty of water and avoid sweet beverages.    Recommended pt to do annual eye exam and routine dental exams/cleanings  Depression, alcohol, fall screening completed as documented above and per flowsheets  Advance Care planning information and packet discussed and offered today, encouraged pt to discuss with family members/spouse/partner/friends and complete Advanced directive packet and bring copy to office   Reviewed Health Maintenance: Health Maintenance  Topic Date Due   PAP SMEAR-Modifier  01/05/2023 (Originally 11/17/2015)   COVID-19 Vaccine (4 - 2023-24 season) 01/21/2023 (Originally 05/05/2022)   PAP-Cervical Cytology Screening  01/05/2024 (Originally 11/17/2015)   INFLUENZA VACCINE  04/05/2023   DTaP/Tdap/Td (2 - Td or Tdap) 01/08/2028   Hepatitis C Screening  Completed   HIV Screening  Completed   HPV VACCINES  Aged Out    Immunizations: Immunization History  Administered Date(s) Administered   Influenza-Unspecified 09/29/2020   Moderna Sars-Covid-2 Vaccination 12/26/2019, 01/25/2020, 09/30/2020   PPD Test 01/27/2022   Tdap 01/07/2018   Vaccines:  HPV: up to at age 26 , ask insurance if age between 79-45  Shingrix: 30-64 yo and ask insurance if covered when patient above 72 yo Pneumonia:  educated and discussed with patient. Flu:  educated and discussed with patient. COVID:      ICD-10-CM   1. Annual physical exam  Z00.00 CBC with Differential/Platelet    COMPLETE METABOLIC PANEL WITH GFR    Lipid panel    VITAMIN D 25 Hydroxy (Vit-D Deficiency, Fractures)    2. Iron deficiency anemia due to chronic blood loss  D50.0 COMPLETE METABOLIC PANEL WITH GFR    Iron, TIBC and Ferritin Panel  Pathologist smear review   poorly controlled on oral iron  for years, still IDA, may need GYN or hematology f/up    3. Prediabetes  R73.03 COMPLETE METABOLIC PANEL WITH GFR    Hemoglobin A1c    4. Morbid obesity (HCC)  E66.01     5. Moderate episode of recurrent major depressive disorder (HCC)  F33.1    recent admission for SI, improved with meds and got established with psych, she feel good now, no SI    6. Social anxiety disorder  F40.10    sx fairly severe, on med per psychiatry and working with psych/therapist    7. Screening for malignant neoplasm of cervix  Z12.4    recommended, pt declined     Return in about 3 months (around 04/07/2023) for Anemia.      Danelle Berry, PA-C 01/05/23 9:57 AM  [facility]

## 2023-01-06 LAB — HEMOGLOBIN A1C: Hgb A1c MFr Bld: 6.4 % of total Hgb — ABNORMAL HIGH (ref <5.7)

## 2023-01-06 LAB — CBC MORPHOLOGY

## 2023-01-08 ENCOUNTER — Other Ambulatory Visit: Payer: Self-pay | Admitting: Family Medicine

## 2023-01-08 DIAGNOSIS — D509 Iron deficiency anemia, unspecified: Secondary | ICD-10-CM

## 2023-01-08 DIAGNOSIS — R7303 Prediabetes: Secondary | ICD-10-CM

## 2023-01-08 DIAGNOSIS — E559 Vitamin D deficiency, unspecified: Secondary | ICD-10-CM

## 2023-01-08 LAB — LIPID PANEL
Cholesterol: 226 mg/dL — ABNORMAL HIGH (ref <200)
HDL: 58 mg/dL (ref >=50)
LDL Cholesterol (Calc): 148 mg/dL (calc) — ABNORMAL HIGH
Non-HDL Cholesterol (Calc): 168 mg/dL (calc) — ABNORMAL HIGH (ref <130)
Total CHOL/HDL Ratio: 3.9 (calc) (ref <5.0)
Triglycerides: 92 mg/dL (ref <150)

## 2023-01-08 LAB — CBC WITH DIFFERENTIAL/PLATELET
Absolute Monocytes: 459 cells/uL (ref 200–950)
Basophils Absolute: 30 cells/uL (ref 0–200)
Basophils Relative: 0.4 %
Eosinophils Absolute: 67 cells/uL (ref 15–500)
Eosinophils Relative: 0.9 %
HCT: 33.7 % — ABNORMAL LOW (ref 35.0–45.0)
Hemoglobin: 10 g/dL — ABNORMAL LOW (ref 11.7–15.5)
Lymphs Abs: 2531 cells/uL (ref 850–3900)
MCH: 20.4 pg — ABNORMAL LOW (ref 27.0–33.0)
MCHC: 29.7 g/dL — ABNORMAL LOW (ref 32.0–36.0)
MCV: 68.6 fL — ABNORMAL LOW (ref 80.0–100.0)
MPV: 9.5 fL (ref 7.5–12.5)
Monocytes Relative: 6.2 %
Neutro Abs: 4314 cells/uL (ref 1500–7800)
Neutrophils Relative %: 58.3 %
Platelets: 432 10*3/uL — ABNORMAL HIGH (ref 140–400)
RBC: 4.91 10*6/uL (ref 3.80–5.10)
RDW: 18 % — ABNORMAL HIGH (ref 11.0–15.0)
Total Lymphocyte: 34.2 %
WBC: 7.4 10*3/uL (ref 3.8–10.8)

## 2023-01-08 LAB — COMPLETE METABOLIC PANEL WITH GFR
AG Ratio: 1.5 (calc) (ref 1.0–2.5)
ALT: 14 U/L (ref 6–29)
AST: 14 U/L (ref 10–30)
Albumin: 4.2 g/dL (ref 3.6–5.1)
Alkaline phosphatase (APISO): 78 U/L (ref 31–125)
BUN: 13 mg/dL (ref 7–25)
CO2: 25 mmol/L (ref 20–32)
Calcium: 9.2 mg/dL (ref 8.6–10.2)
Chloride: 103 mmol/L (ref 98–110)
Creat: 0.59 mg/dL (ref 0.50–0.96)
Globulin: 2.8 g/dL (calc) (ref 1.9–3.7)
Glucose, Bld: 108 mg/dL — ABNORMAL HIGH (ref 65–99)
Potassium: 4.5 mmol/L (ref 3.5–5.3)
Sodium: 138 mmol/L (ref 135–146)
Total Bilirubin: 0.4 mg/dL (ref 0.2–1.2)
Total Protein: 7 g/dL (ref 6.1–8.1)
eGFR: 126 mL/min/{1.73_m2} (ref >=60)

## 2023-01-08 LAB — IRON,TIBC AND FERRITIN PANEL
%SAT: 5 % (calc) — ABNORMAL LOW (ref 16–45)
Ferritin: 5 ng/mL — ABNORMAL LOW (ref 16–154)
Iron: 21 ug/dL — ABNORMAL LOW (ref 40–190)
TIBC: 427 mcg/dL (calc) (ref 250–450)

## 2023-01-08 LAB — PATHOLOGIST SMEAR REVIEW

## 2023-01-08 LAB — VITAMIN D 25 HYDROXY (VIT D DEFICIENCY, FRACTURES): Vit D, 25-Hydroxy: 10 ng/mL — ABNORMAL LOW (ref 30–100)

## 2023-01-08 LAB — HEMOGLOBIN A1C
Mean Plasma Glucose: 137 mg/dL
eAG (mmol/L): 7.6 mmol/L

## 2023-01-08 MED ORDER — VITAMIN D (ERGOCALCIFEROL) 1.25 MG (50000 UNIT) PO CAPS
50000.0000 [IU] | ORAL_CAPSULE | ORAL | 1 refills | Status: DC
Start: 2023-01-08 — End: 2023-04-13

## 2023-01-17 ENCOUNTER — Inpatient Hospital Stay: Payer: Medicaid Other | Attending: Oncology | Admitting: Oncology

## 2023-01-17 ENCOUNTER — Encounter: Payer: Self-pay | Admitting: Oncology

## 2023-01-17 ENCOUNTER — Inpatient Hospital Stay: Payer: Medicaid Other

## 2023-01-17 VITALS — BP 139/96 | HR 96 | Temp 96.0°F | Resp 18

## 2023-01-17 DIAGNOSIS — D500 Iron deficiency anemia secondary to blood loss (chronic): Secondary | ICD-10-CM | POA: Insufficient documentation

## 2023-01-17 DIAGNOSIS — N920 Excessive and frequent menstruation with regular cycle: Secondary | ICD-10-CM | POA: Diagnosis not present

## 2023-01-17 NOTE — Assessment & Plan Note (Addendum)
Labs are reviewed and discussed with patient. I discussed about option of continue oral iron supplementation and repeat blood work for evaluation of treatment response.  If no significant improvement, then proceed with IV Venofer treatments. Alternative option of proceed with IV Venofer treatments. I discussed about the potential risks including but not limited to allergic reactions/infusion reactions including anaphylactic reactions, phlebitis, high blood pressure, wheezing, SOB, skin rash, weight gain,dark urine, leg swelling, back pain, headache, nausea and fatigue, etc. Patient prefers IV Venofer treatments.  Plan IV venofer weekly x 4 She declines any chance of pregnancy due to not sexually active.   She declines urine pregnancy tests . She is not sexually active.

## 2023-01-17 NOTE — Progress Notes (Addendum)
Hematology/Oncology Consult note [phone] [phone]      Patient Care Team: Danelle Berry, PA-C as PCP - General (Family Medicine)   REFERRING PROVIDER: Danelle Berry, PA-C  CHIEF COMPLAINTS/REASON FOR VISIT:  Anemia  ASSESSMENT & PLAN:  Iron deficiency anemia due to chronic blood loss Labs are reviewed and discussed with patient. I discussed about option of continue oral iron supplementation and repeat blood work for evaluation of treatment response.  If no significant improvement, then proceed with IV Venofer treatments. Alternative option of proceed with IV Venofer treatments. I discussed about the potential risks including but not limited to allergic reactions/infusion reactions including anaphylactic reactions, phlebitis, high blood pressure, wheezing, SOB, skin rash, weight gain,dark urine, leg swelling, back pain, headache, nausea and fatigue, etc. Patient prefers IV Venofer treatments.  Plan IV venofer weekly x 4 She declines any chance of pregnancy due to not sexually active.   She declines urine pregnancy tests . She is not sexually active.  Orders Placed This Encounter  Procedures   CBC with Differential (Cancer Center Only)    Standing Status:   Future    Standing Expiration Date:   01/17/2024   Ferritin    Standing Status:   Future    Standing Expiration Date:   01/17/2024   Iron and TIBC    Standing Status:   Future    Standing Expiration Date:   01/17/2024   Retic Panel    Standing Status:   Future    Standing Expiration Date:   01/17/2024   Follow up in 3 months.  All questions were answered. The patient knows to call the clinic with any problems, questions or concerns.  Rickard Patience, MD, PhD [facility] Hematology Oncology 01/17/2023     HISTORY OF PRESENTING ILLNESS:  Emily Huang is a  27 y.o.  female with PMH listed below who was referred to me for anemia Reviewed patient's recent labs that was done.  She was found to have  abnormal CBC on 01/05/23, Hb 10, mcv 68.6, platelet 432 Reviewed patient's previous labs ordered by primary care physician's office, anemia is chronic onset , duration is since  She reports a history of heavy irregular period which now has improved.  + fatigue She denies recent chest pain on exertion, shortness of breath on minimal exertion, pre-syncopal episodes, or palpitations She had not noticed any recent bleeding such as epistaxis, hematuria or hematochezia.  She denies over the counter NSAID ingestion. She is not on antiplatelets agents.   MEDICAL HISTORY:  Past Medical History:  Diagnosis Date   Anemia    Anxiety    Clotting disorder (HCC)    Depression    Hypertension     SURGICAL HISTORY: History reviewed. No pertinent surgical history.  SOCIAL HISTORY: Social History   Socioeconomic History   Marital status: Single    Spouse name: Not on file   Number of children: Not on file   Years of education: Not on file   Highest education level: Not on file  Occupational History   Not on file  Tobacco Use   Smoking status: Never   Smokeless tobacco: Never  Vaping Use   Vaping Use: Never used  Substance and Sexual Activity   Alcohol use: Not Currently    Comment: everyother weekend   Drug use: Never   Sexual activity: Never    Birth control/protection: Abstinence  Other Topics Concern   Not on file  Social History Narrative   Not on  file   Social Determinants of Health   Financial Resource Strain: Low Risk  (01/05/2023)   Overall Financial Resource Strain (CARDIA)    Difficulty of Paying Living Expenses: Not hard at all  Food Insecurity: No Food Insecurity (01/05/2023)   Hunger Vital Sign    Worried About Running Out of Food in the Last Year: Never true    Ran Out of Food in the Last Year: Never true  Transportation Needs: No Transportation Needs (01/05/2023)   PRAPARE - Administrator, Civil Service (Medical): No    Lack of Transportation  (Non-Medical): No  Physical Activity: Inactive (01/05/2023)   Exercise Vital Sign    Days of Exercise per Week: 0 days    Minutes of Exercise per Session: 0 min  Stress: Stress Concern Present (01/05/2023)   [employer] of Occupational Health - Occupational Stress Questionnaire    Feeling of Stress : To some extent  Social Connections: Socially Isolated (01/05/2023)   Social Connection and Isolation Panel [NHANES]    Frequency of Communication with Friends and Family: More than three times a week    Frequency of Social Gatherings with Friends and Family: More than three times a week    Attends Religious Services: Never    Database administrator or Organizations: No    Attends Banker Meetings: Never    Marital Status: Never married  Intimate Partner Violence: Not At Risk (01/05/2023)   Humiliation, Afraid, Rape, and Kick questionnaire    Fear of Current or Ex-Partner: No    Emotionally Abused: No    Physically Abused: No    Sexually Abused: No    FAMILY HISTORY: Family History  Problem Relation Age of Onset   Anemia Mother    Hyperlipidemia Mother    Hypertension Mother    Migraines Mother    Cancer Mother 61       breast   Anxiety disorder Mother    Kidney disease Maternal Grandmother     ALLERGIES:  has No Known Allergies.  MEDICATIONS:  Current Outpatient Medications  Medication Sig Dispense Refill   ferrous sulfate 325 (65 FE) MG tablet Take 1 tablet (325 mg total) by mouth daily. 30 tablet 3   hydrOXYzine (ATARAX) 25 MG tablet Take 25 mg by mouth daily.     sertraline (ZOLOFT) 50 MG tablet Take 50 mg by mouth daily.     Vitamin D, Ergocalciferol, (DRISDOL) 1.25 MG (50000 UNIT) CAPS capsule Take 1 capsule (50,000 Units total) by mouth every 7 (seven) days. x12 weeks. 12 capsule 1   No current facility-administered medications for this visit.    Review of Systems  Constitutional:  Positive for fatigue. Negative for appetite change, chills and fever.   HENT:   Negative for hearing loss and voice change.   Eyes:  Negative for eye problems.  Respiratory:  Negative for chest tightness and cough.   Cardiovascular:  Negative for chest pain.  Gastrointestinal:  Negative for abdominal distention, abdominal pain and blood in stool.  Endocrine: Negative for hot flashes.  Genitourinary:  Negative for difficulty urinating and frequency.   Musculoskeletal:  Negative for arthralgias.  Skin:  Negative for itching and rash.  Neurological:  Negative for extremity weakness.  Hematological:  Negative for adenopathy.  Psychiatric/Behavioral:  Negative for confusion.     PHYSICAL EXAMINATION: Vitals:   01/17/23 0940  BP: (!) 139/96  Pulse: 96  Resp: 18  Temp: (!) 96 F (35.6 C)  SpO2: 100%  There were no vitals filed for this visit.  Physical Exam Constitutional:      General: She is not in acute distress.    Appearance: She is obese.  HENT:     Head: Normocephalic and atraumatic.  Eyes:     General: No scleral icterus. Cardiovascular:     Rate and Rhythm: Normal rate and regular rhythm.     Heart sounds: Normal heart sounds.  Pulmonary:     Effort: Pulmonary effort is normal. No respiratory distress.     Breath sounds: No wheezing.  Abdominal:     General: Bowel sounds are normal. There is no distension.     Palpations: Abdomen is soft.  Musculoskeletal:        General: No deformity. Normal range of motion.     Cervical back: Normal range of motion and neck supple.  Skin:    General: Skin is warm and dry.     Findings: No erythema or rash.  Neurological:     Mental Status: She is alert and oriented to person, place, and time. Mental status is at baseline.     Cranial Nerves: No cranial nerve deficit.     Coordination: Coordination normal.  Psychiatric:        Mood and Affect: Mood normal.      LABORATORY DATA:  I have reviewed the data as listed    Latest Ref Rng & Units 01/05/2023   10:16 AM 11/09/2020    7:39 AM  09/30/2020   11:25 AM  CBC  WBC 3.8 - 10.8 Thousand/uL 7.4  13.7  8.3   Hemoglobin 11.7 - 15.5 g/dL 16.1  8.7  09.6   Hematocrit 35.0 - 45.0 % 33.7  30.6  37.1   Platelets 140 - 400 Thousand/uL 432  485  473       Latest Ref Rng & Units 01/05/2023   10:16 AM 11/09/2020    7:39 AM 09/30/2020   11:25 AM  CMP  Glucose 65 - 99 mg/dL 045  409  81   BUN 7 - 25 mg/dL 13  11  9    Creatinine 0.50 - 0.96 mg/dL 8.11  9.14  7.82   Sodium 135 - 146 mmol/L 138  138  141   Potassium 3.5 - 5.3 mmol/L 4.5  4.7  4.2   Chloride 98 - 110 mmol/L 103  106  105   CO2 20 - 32 mmol/L 25  24  25    Calcium 8.6 - 10.2 mg/dL 9.2  9.1  9.6   Total Protein 6.1 - 8.1 g/dL 7.0   7.4   Total Bilirubin 0.2 - 1.2 mg/dL 0.4   0.5   AST 10 - 30 U/L 14   17   ALT 6 - 29 U/L 14   19    Lab Results  Component Value Date   IRON 21 (L) 01/05/2023   TIBC 427 01/05/2023   IRONPCTSAT 5 (L) 01/05/2023   FERRITIN 5 (L) 01/05/2023     RADIOGRAPHIC STUDIES: I have personally reviewed the radiological images as listed and agreed with the findings in the report. No results found.

## 2023-01-23 ENCOUNTER — Encounter: Payer: Self-pay | Admitting: Oncology

## 2023-01-23 MED FILL — Iron Sucrose Inj 20 MG/ML (Fe Equiv): INTRAVENOUS | Qty: 10 | Status: AC

## 2023-01-23 NOTE — Addendum Note (Signed)
Addended by: Rickard Patience on: 01/23/2023 08:06 AM   Modules accepted: Orders

## 2023-01-24 ENCOUNTER — Inpatient Hospital Stay: Payer: Medicaid Other

## 2023-01-24 VITALS — BP 143/80 | HR 99 | Resp 18

## 2023-01-24 DIAGNOSIS — N920 Excessive and frequent menstruation with regular cycle: Secondary | ICD-10-CM | POA: Diagnosis not present

## 2023-01-24 DIAGNOSIS — D500 Iron deficiency anemia secondary to blood loss (chronic): Secondary | ICD-10-CM

## 2023-01-24 MED ORDER — SODIUM CHLORIDE 0.9 % IV SOLN
Freq: Once | INTRAVENOUS | Status: AC
  Filled 2023-01-24: qty 250

## 2023-01-24 MED ORDER — SODIUM CHLORIDE 0.9 % IV SOLN
200.0000 mg | Freq: Once | INTRAVENOUS | Status: AC
  Administered 2023-01-24: 200 mg via INTRAVENOUS
  Filled 2023-01-24: qty 200

## 2023-01-24 NOTE — Patient Instructions (Signed)

## 2023-01-31 ENCOUNTER — Inpatient Hospital Stay: Payer: Medicaid Other

## 2023-01-31 VITALS — BP 128/78 | HR 80 | Temp 97.2°F | Resp 18

## 2023-01-31 DIAGNOSIS — N920 Excessive and frequent menstruation with regular cycle: Secondary | ICD-10-CM | POA: Diagnosis not present

## 2023-01-31 DIAGNOSIS — D500 Iron deficiency anemia secondary to blood loss (chronic): Secondary | ICD-10-CM

## 2023-01-31 MED ORDER — SODIUM CHLORIDE 0.9 % IV SOLN
200.0000 mg | Freq: Once | INTRAVENOUS | Status: AC
  Administered 2023-01-31: 200 mg via INTRAVENOUS
  Filled 2023-01-31: qty 200

## 2023-01-31 MED ORDER — SODIUM CHLORIDE 0.9 % IV SOLN
Freq: Once | INTRAVENOUS | Status: AC
  Filled 2023-01-31: qty 250

## 2023-02-03 ENCOUNTER — Encounter: Payer: Self-pay | Admitting: Oncology

## 2023-02-07 ENCOUNTER — Inpatient Hospital Stay: Payer: Medicaid Other | Attending: Oncology

## 2023-02-07 VITALS — BP 118/86 | HR 88 | Temp 98.1°F | Resp 20

## 2023-02-07 DIAGNOSIS — N920 Excessive and frequent menstruation with regular cycle: Secondary | ICD-10-CM | POA: Insufficient documentation

## 2023-02-07 DIAGNOSIS — D500 Iron deficiency anemia secondary to blood loss (chronic): Secondary | ICD-10-CM | POA: Insufficient documentation

## 2023-02-07 MED ORDER — SODIUM CHLORIDE 0.9 % IV SOLN
Freq: Once | INTRAVENOUS | Status: AC
  Filled 2023-02-07: qty 250

## 2023-02-07 MED ORDER — SODIUM CHLORIDE 0.9 % IV SOLN
200.0000 mg | Freq: Once | INTRAVENOUS | Status: AC
  Administered 2023-02-07: 200 mg via INTRAVENOUS
  Filled 2023-02-07: qty 200

## 2023-02-07 NOTE — Patient Instructions (Signed)

## 2023-02-14 ENCOUNTER — Inpatient Hospital Stay: Payer: Medicaid Other

## 2023-02-14 VITALS — BP 121/72 | HR 86 | Temp 97.8°F | Resp 18

## 2023-02-14 DIAGNOSIS — N920 Excessive and frequent menstruation with regular cycle: Secondary | ICD-10-CM | POA: Diagnosis not present

## 2023-02-14 DIAGNOSIS — D500 Iron deficiency anemia secondary to blood loss (chronic): Secondary | ICD-10-CM

## 2023-02-14 MED ORDER — SODIUM CHLORIDE 0.9 % IV SOLN
Freq: Once | INTRAVENOUS | Status: AC
  Filled 2023-02-14: qty 250

## 2023-02-14 MED ORDER — SODIUM CHLORIDE 0.9 % IV SOLN
200.0000 mg | Freq: Once | INTRAVENOUS | Status: AC
  Administered 2023-02-14: 200 mg via INTRAVENOUS
  Filled 2023-02-14: qty 200

## 2023-04-02 ENCOUNTER — Encounter: Payer: Self-pay | Admitting: Oncology

## 2023-04-09 ENCOUNTER — Ambulatory Visit: Payer: Medicaid Other | Admitting: Family Medicine

## 2023-04-09 DIAGNOSIS — R7303 Prediabetes: Secondary | ICD-10-CM

## 2023-04-09 DIAGNOSIS — D509 Iron deficiency anemia, unspecified: Secondary | ICD-10-CM

## 2023-04-11 ENCOUNTER — Encounter: Payer: Self-pay | Admitting: Oncology

## 2023-04-13 ENCOUNTER — Ambulatory Visit: Payer: MEDICAID | Admitting: Family Medicine

## 2023-04-13 ENCOUNTER — Encounter: Payer: Self-pay | Admitting: Family Medicine

## 2023-04-13 VITALS — BP 130/84 | HR 94 | Temp 97.6°F | Resp 16 | Ht 63.0 in | Wt 363.0 lb

## 2023-04-13 DIAGNOSIS — F331 Major depressive disorder, recurrent, moderate: Secondary | ICD-10-CM | POA: Diagnosis not present

## 2023-04-13 DIAGNOSIS — E559 Vitamin D deficiency, unspecified: Secondary | ICD-10-CM

## 2023-04-13 DIAGNOSIS — D500 Iron deficiency anemia secondary to blood loss (chronic): Secondary | ICD-10-CM

## 2023-04-13 DIAGNOSIS — N921 Excessive and frequent menstruation with irregular cycle: Secondary | ICD-10-CM

## 2023-04-13 DIAGNOSIS — D509 Iron deficiency anemia, unspecified: Secondary | ICD-10-CM

## 2023-04-13 MED ORDER — VITAMIN D (ERGOCALCIFEROL) 1.25 MG (50000 UNIT) PO CAPS
50000.0000 [IU] | ORAL_CAPSULE | ORAL | 1 refills | Status: AC
Start: 2023-04-13 — End: ?

## 2023-04-13 NOTE — Progress Notes (Signed)
Name: Emily Huang   MRN: [id number]    DOB: [birth year]   Date:04/13/2023       Progress Note  Chief Complaint  Patient presents with   Follow-up   Anemia     Subjective:   Emily Huang is a 27 y.o. female, presents to clinic for follow-up on anemia but she has established with hematology and has gotten infusions and there is a follow-up plan in place  Patient continues to deny any heavy periods Regular cycles 5-6 d, no heavy bleeding pt states  Pads - thicker pads on day 2 and 3 and then changes to lighter pad on the other day Changes pad every 2-3 hours on heavy days  Patient has never seen GYN or done a pelvic ultrasound  Reviewed most recent labs and upcoming office visits and labs Hemoglobin  Date Value Ref Range Status  01/05/2023 10.0 (L) 11.7 - 15.5 g/dL Final  40/98/1191 8.7 (L) 12.0 - 15.0 g/dL Final    Comment:    Reticulocyte Hemoglobin testing may be clinically indicated, consider ordering this additional test [id number]   09/30/2020 11.0 (L) 11.7 - 15.5 g/dL Final  13/04/6577 46.9 11.7 - 15.5 g/dL Final    Lab Results  Component Value Date   IRON 21 (L) 01/05/2023   TIBC 427 01/05/2023   FERRITIN 5 (L) 01/05/2023   She denies any palpitations, cold intolerance, fatigue, exertional shortness of breath    Last vitamin D Lab Results  Component Value Date   VD25OH 10 (L) 01/05/2023   Therapist and [facility] doing therapy and zoloft    04/13/2023   11:29 AM 01/05/2023    9:39 AM 11/24/2020   10:40 AM 09/30/2020   10:42 AM 01/07/2018    2:06 PM  Depression screen PHQ 2/9  Decreased Interest 0 0 0 1 0  Down, Depressed, Hopeless 0 0 0 3 0  PHQ - 2 Score 0 0 0 4 0  Altered sleeping 0 0  2   Tired, decreased energy 0 0  1   Change in appetite 0 0  0   Feeling bad or failure about yourself  0 0  3   Trouble concentrating 0 0  0   Moving slowly or fidgety/restless 0 0  1   Suicidal thoughts 0 0  0   PHQ-9 Score 0 0  11   Difficult doing work/chores Not  difficult at all Not difficult at all  Somewhat difficult       04/13/2023   11:33 AM 09/30/2020   10:46 AM  GAD 7 : Generalized Anxiety Score  Nervous, Anxious, on Edge 1 1  Control/stop worrying 1 0  Worry too much - different things 1 1  Trouble relaxing 1 0  Restless 1 0  Easily annoyed or irritable 1 0  Afraid - awful might happen 1 0  Total GAD 7 Score 7 2  Anxiety Difficulty Somewhat difficult         Current Outpatient Medications:    ferrous sulfate 325 (65 FE) MG tablet, Take 1 tablet (325 mg total) by mouth daily., Disp: 30 tablet, Rfl: 3   hydrOXYzine (ATARAX) 25 MG tablet, Take 25 mg by mouth daily., Disp: , Rfl:    sertraline (ZOLOFT) 50 MG tablet, Take 50 mg by mouth daily., Disp: , Rfl:    Vitamin D, Ergocalciferol, (DRISDOL) 1.25 MG (50000 UNIT) CAPS capsule, Take 1 capsule (50,000 Units total) by mouth every 7 (seven) days. x12 weeks.,  Disp: 12 capsule, Rfl: 1  Patient Active Problem List   Diagnosis Date Noted   Menorrhagia with irregular cycle 09/30/2020   Hyperlipidemia 09/30/2020   Current moderate episode of major depressive disorder (HCC) 09/30/2020   Family history of breast cancer in mother 09/30/2020   Iron deficiency anemia due to chronic blood loss 09/30/2020   Insulin resistance 01/15/2018   Morbid obesity (HCC) 01/07/2018   Social anxiety disorder 01/07/2018    No past surgical history on file.  Family History  Problem Relation Age of Onset   Anemia Mother    Hyperlipidemia Mother    Hypertension Mother    Migraines Mother    Cancer Mother 66       breast   Anxiety disorder Mother    Kidney disease Maternal Grandmother     Social History   Tobacco Use   Smoking status: Never   Smokeless tobacco: Never  Vaping Use   Vaping status: Never Used  Substance Use Topics   Alcohol use: Not Currently    Comment: everyother weekend   Drug use: Never     No Known Allergies  Health Maintenance  Topic Date Due   PAP SMEAR-Modifier   Never done   INFLUENZA VACCINE  04/05/2023   COVID-19 Vaccine (4 - 2023-24 season) 04/29/2023 (Originally 05/05/2022)   PAP-Cervical Cytology Screening  01/05/2024 (Originally 11/17/2015)   DTaP/Tdap/Td (2 - Td or Tdap) 01/08/2028   Hepatitis C Screening  Completed   HIV Screening  Completed   HPV VACCINES  Aged Out    Chart Review Today: I personally reviewed active problem list, medication list, allergies, family history, social history, health maintenance, notes from last encounter, lab results, imaging with the patient/caregiver today.   Review of Systems  Constitutional: Negative.   HENT: Negative.    Eyes: Negative.   Respiratory: Negative.    Cardiovascular: Negative.   Gastrointestinal: Negative.   Endocrine: Negative.   Genitourinary: Negative.   Musculoskeletal: Negative.   Skin: Negative.   Allergic/Immunologic: Negative.   Neurological: Negative.   Hematological: Negative.   Psychiatric/Behavioral: Negative.    All other systems reviewed and are negative.    Objective:   Vitals:   04/13/23 1129  BP: 136/82  Pulse: 94  Resp: 16  Temp: 97.6 F (36.4 C)  TempSrc: Oral  SpO2: 98%  Weight: (!) 363 lb (164.7 kg)  Height: 5\' 3"  (1.6 m)    Body mass index is 64.3 kg/m.  Physical Exam Vitals and nursing note reviewed.  Constitutional:      General: She is not in acute distress.    Appearance: She is well-developed. She is morbidly obese. She is not ill-appearing, toxic-appearing or diaphoretic.  HENT:     Head: Normocephalic and atraumatic.     Nose: Nose normal.  Eyes:     General:        Right eye: No discharge.        Left eye: No discharge.     Conjunctiva/sclera: Conjunctivae normal.  Neck:     Trachea: No tracheal deviation.  Cardiovascular:     Rate and Rhythm: Normal rate and regular rhythm.  Pulmonary:     Effort: Pulmonary effort is normal. No respiratory distress.     Breath sounds: No stridor.  Musculoskeletal:        General: Normal  range of motion.  Skin:    General: Skin is warm and dry.     Findings: No rash.  Neurological:  Mental Status: She is alert.     Motor: No abnormal muscle tone.     Coordination: Coordination normal.  Psychiatric:        Behavior: Behavior normal. Behavior is cooperative.         Assessment & Plan:     ICD-10-CM   1. Iron deficiency anemia, unspecified iron deficiency anemia type  D50.9 Ambulatory referral to Obstetrics / Gynecology   she has f/up with Dr. Cathie Hoops next week    2. Iron deficiency anemia due to chronic blood loss  D50.0 Ambulatory referral to Obstetrics / Gynecology   need GYN eval and pelvic US, menses causing IDA for many years requiring infusions    3. Vitamin D deficiency  E55.9 Vitamin D, Ergocalciferol, (DRISDOL) 1.25 MG (50000 UNIT) CAPS capsule   continue vit D supplement, we will check at her next appt with labs    4. Moderate episode of recurrent major depressive disorder (HCC)  F33.1    managed by [facility] psych, on meds    5. Menorrhagia with irregular cycle  N92.1 Ambulatory referral to Obstetrics / Gynecology         Return in about 6 months (around 10/14/2023).   Danelle Berry, PA-C 04/13/23 11:44 AM

## 2023-04-17 MED FILL — Iron Sucrose Inj 20 MG/ML (Fe Equiv): INTRAVENOUS | Qty: 10 | Status: AC

## 2023-04-18 ENCOUNTER — Inpatient Hospital Stay: Payer: MEDICAID | Attending: Oncology

## 2023-04-18 ENCOUNTER — Encounter: Payer: Self-pay | Admitting: Oncology

## 2023-04-18 DIAGNOSIS — D500 Iron deficiency anemia secondary to blood loss (chronic): Secondary | ICD-10-CM | POA: Insufficient documentation

## 2023-04-18 DIAGNOSIS — N920 Excessive and frequent menstruation with regular cycle: Secondary | ICD-10-CM | POA: Diagnosis present

## 2023-04-18 LAB — CBC WITH DIFFERENTIAL (CANCER CENTER ONLY)
Abs Immature Granulocytes: 0.08 10*3/uL — ABNORMAL HIGH (ref 0.00–0.07)
Basophils Absolute: 0 10*3/uL (ref 0.0–0.1)
Basophils Relative: 0 %
Eosinophils Absolute: 0.1 10*3/uL (ref 0.0–0.5)
Eosinophils Relative: 1 %
HCT: 42 % (ref 36.0–46.0)
Hemoglobin: 13.6 g/dL (ref 12.0–15.0)
Immature Granulocytes: 1 %
Lymphocytes Relative: 31 %
Lymphs Abs: 3 10*3/uL (ref 0.7–4.0)
MCH: 25.9 pg — ABNORMAL LOW (ref 26.0–34.0)
MCHC: 32.4 g/dL (ref 30.0–36.0)
MCV: 80 fL (ref 80.0–100.0)
Monocytes Absolute: 0.5 10*3/uL (ref 0.1–1.0)
Monocytes Relative: 6 %
Neutro Abs: 6.1 10*3/uL (ref 1.7–7.7)
Neutrophils Relative %: 61 %
Platelet Count: 377 10*3/uL (ref 150–400)
RBC: 5.25 MIL/uL — ABNORMAL HIGH (ref 3.87–5.11)
RDW: 17.6 % — ABNORMAL HIGH (ref 11.5–15.5)
WBC Count: 9.7 10*3/uL (ref 4.0–10.5)
nRBC: 0 % (ref 0.0–0.2)

## 2023-04-18 LAB — IRON AND TIBC
Iron: 55 ug/dL (ref 28–170)
Saturation Ratios: 13 % (ref 10.4–31.8)
TIBC: 424 ug/dL (ref 250–450)
UIBC: 369 ug/dL

## 2023-04-18 LAB — RETIC PANEL
Immature Retic Fract: 8.8 % (ref 2.3–15.9)
RBC.: 5.27 MIL/uL — ABNORMAL HIGH (ref 3.87–5.11)
Retic Count, Absolute: 56.9 10*3/uL (ref 19.0–186.0)
Retic Ct Pct: 1.1 % (ref 0.4–3.1)
Reticulocyte Hemoglobin: 28.3 pg (ref >27.9)

## 2023-04-18 LAB — FERRITIN: Ferritin: 26 ng/mL (ref 11–307)

## 2023-04-19 ENCOUNTER — Encounter: Payer: Self-pay | Admitting: Oncology

## 2023-04-19 ENCOUNTER — Inpatient Hospital Stay (HOSPITAL_BASED_OUTPATIENT_CLINIC_OR_DEPARTMENT_OTHER): Payer: MEDICAID | Admitting: Oncology

## 2023-04-19 ENCOUNTER — Inpatient Hospital Stay: Payer: MEDICAID

## 2023-04-19 VITALS — BP 134/97 | HR 93 | Temp 97.2°F | Resp 18 | Wt 368.4 lb

## 2023-04-19 DIAGNOSIS — D500 Iron deficiency anemia secondary to blood loss (chronic): Secondary | ICD-10-CM

## 2023-04-19 NOTE — Assessment & Plan Note (Addendum)
Labs are reviewed and discussed with patient. Lab Results  Component Value Date   HGB 13.6 04/18/2023   TIBC 424 04/18/2023   IRONPCTSAT 13 04/18/2023   FERRITIN 26 04/18/2023    Both hemoglobin and iron panel have improved.  Hold off additional IV venofer treatments.  Recommend patient to take iron contained multi vitamin for maintenance.

## 2023-04-19 NOTE — Progress Notes (Signed)
Hematology/Oncology Consult note [phone] [phone]      Patient Care Team: Danelle Berry, PA-C as PCP - General (Family Medicine) Rickard Patience, MD as Consulting Physician (Oncology)   REFERRING PROVIDER: Danelle Berry, PA-C  CHIEF COMPLAINTS/REASON FOR VISIT:  Anemia  ASSESSMENT & PLAN:  Iron deficiency anemia due to chronic blood loss Labs are reviewed and discussed with patient. Lab Results  Component Value Date   HGB 13.6 04/18/2023   TIBC 424 04/18/2023   IRONPCTSAT 13 04/18/2023   FERRITIN 26 04/18/2023    Both hemoglobin and iron panel have improved.    No orders of the defined types were placed in this encounter.  Follow up PRN  I recommend patient to continue follow up with primary care physician. Patient may re-establish care in the future if clinically indicated.  All questions were answered. The patient knows to call the clinic with any problems, questions or concerns.  Rickard Patience, MD, PhD [facility] Hematology Oncology 04/19/2023     HISTORY OF PRESENTING ILLNESS:  Emily Huang is a  27 y.o.  female with PMH listed below who was referred to me for anemia Reviewed patient's recent labs that was done.  She was found to have abnormal CBC on 01/05/23, Hb 10, mcv 68.6, platelet 432 Reviewed patient's previous labs ordered by primary care physician's office, anemia is chronic onset , duration is since  She reports a history of heavy irregular period which now has improved.  + fatigue She denies recent chest pain on exertion, shortness of breath on minimal exertion, pre-syncopal episodes, or palpitations She had not noticed any recent bleeding such as epistaxis, hematuria or hematochezia.  She denies over the counter NSAID ingestion. She is not on antiplatelets agents.   INTERVAL HISTORY Emily Huang is a 27 y.o. female who has above history reviewed by me today presents for follow up visit for iron deficiency anemia.  S/p Venofer,  she tolerated well.    MEDICAL HISTORY:  Past Medical History:  Diagnosis Date   Anemia    Anxiety    Clotting disorder (HCC)    Depression    Hypertension     SURGICAL HISTORY: No past surgical history on file.  SOCIAL HISTORY: Social History   Socioeconomic History   Marital status: Single    Spouse name: Not on file   Number of children: Not on file   Years of education: Not on file   Highest education level: Some college, no degree  Occupational History   Not on file  Tobacco Use   Smoking status: Never   Smokeless tobacco: Never  Vaping Use   Vaping status: Never Used  Substance and Sexual Activity   Alcohol use: Not Currently    Comment: everyother weekend   Drug use: Never   Sexual activity: Never    Birth control/protection: Abstinence  Other Topics Concern   Not on file  Social History Narrative   Not on file   Social Determinants of Health   Financial Resource Strain: Medium Risk (04/12/2023)   Overall Financial Resource Strain (CARDIA)    Difficulty of Paying Living Expenses: Somewhat hard  Food Insecurity: No Food Insecurity (04/12/2023)   Hunger Vital Sign    Worried About Running Out of Food in the Last Year: Never true    Ran Out of Food in the Last Year: Never true  Transportation Needs: No Transportation Needs (04/12/2023)   PRAPARE - Administrator, Civil Service (Medical): No  Lack of Transportation (Non-Medical): No  Physical Activity: Insufficiently Active (04/12/2023)   Exercise Vital Sign    Days of Exercise per Week: 3 days    Minutes of Exercise per Session: 30 min  Stress: Stress Concern Present (04/12/2023)   [employer] of Occupational Health - Occupational Stress Questionnaire    Feeling of Stress : Rather much  Social Connections: Socially Isolated (04/12/2023)   Social Connection and Isolation Panel [NHANES]    Frequency of Communication with Friends and Family: More than three times a week    Frequency of Social  Gatherings with Friends and Family: Never    Attends Religious Services: Never    Database administrator or Organizations: No    Attends Banker Meetings: Never    Marital Status: Never married  Intimate Partner Violence: Not At Risk (01/05/2023)   Humiliation, Afraid, Rape, and Kick questionnaire    Fear of Current or Ex-Partner: No    Emotionally Abused: No    Physically Abused: No    Sexually Abused: No    FAMILY HISTORY: Family History  Problem Relation Age of Onset   Anemia Mother    Hyperlipidemia Mother    Hypertension Mother    Migraines Mother    Cancer Mother 70       breast   Anxiety disorder Mother    Kidney disease Maternal Grandmother     ALLERGIES:  has No Known Allergies.  MEDICATIONS:  Current Outpatient Medications  Medication Sig Dispense Refill   ferrous sulfate 325 (65 FE) MG tablet Take 1 tablet (325 mg total) by mouth daily. 30 tablet 3   hydrOXYzine (ATARAX) 25 MG tablet Take 25 mg by mouth daily.     sertraline (ZOLOFT) 50 MG tablet Take 50 mg by mouth daily.     Vitamin D, Ergocalciferol, (DRISDOL) 1.25 MG (50000 UNIT) CAPS capsule Take 1 capsule (50,000 Units total) by mouth every 7 (seven) days. x12 weeks. 12 capsule 1   No current facility-administered medications for this visit.    Review of Systems  Constitutional:  Positive for fatigue. Negative for appetite change, chills and fever.  HENT:   Negative for hearing loss and voice change.   Eyes:  Negative for eye problems.  Respiratory:  Negative for chest tightness and cough.   Cardiovascular:  Negative for chest pain.  Gastrointestinal:  Negative for abdominal distention, abdominal pain and blood in stool.  Endocrine: Negative for hot flashes.  Genitourinary:  Negative for difficulty urinating and frequency.   Musculoskeletal:  Negative for arthralgias.  Skin:  Negative for itching and rash.  Neurological:  Negative for extremity weakness.  Hematological:  Negative for  adenopathy.  Psychiatric/Behavioral:  Negative for confusion.     PHYSICAL EXAMINATION: Vitals:   04/19/23 1440  BP: (!) 134/97  Pulse: 93  Resp: 18  Temp: (!) 97.2 F (36.2 C)  SpO2: 100%   Filed Weights   04/19/23 1440  Weight: (!) 368 lb 6.4 oz (167.1 kg)    Physical Exam Constitutional:      General: She is not in acute distress.    Appearance: She is obese.  HENT:     Head: Normocephalic and atraumatic.  Eyes:     General: No scleral icterus. Cardiovascular:     Rate and Rhythm: Normal rate and regular rhythm.     Heart sounds: Normal heart sounds.  Pulmonary:     Effort: Pulmonary effort is normal. No respiratory distress.     Breath  sounds: No wheezing.  Abdominal:     General: Bowel sounds are normal. There is no distension.     Palpations: Abdomen is soft.  Musculoskeletal:        General: No deformity. Normal range of motion.     Cervical back: Normal range of motion and neck supple.  Skin:    General: Skin is warm and dry.     Findings: No erythema or rash.  Neurological:     Mental Status: She is alert and oriented to person, place, and time. Mental status is at baseline.     Cranial Nerves: No cranial nerve deficit.     Coordination: Coordination normal.  Psychiatric:        Mood and Affect: Mood normal.      LABORATORY DATA:  I have reviewed the data as listed    Latest Ref Rng & Units 04/18/2023    2:25 PM 01/05/2023   10:16 AM 11/09/2020    7:39 AM  CBC  WBC 4.0 - 10.5 K/uL 9.7  7.4  13.7   Hemoglobin 12.0 - 15.0 g/dL 11.9  14.7  8.7   Hematocrit 36.0 - 46.0 % 42.0  33.7  30.6   Platelets 150 - 400 K/uL 377  432  485       Latest Ref Rng & Units 01/05/2023   10:16 AM 11/09/2020    7:39 AM 09/30/2020   11:25 AM  CMP  Glucose 65 - 99 mg/dL 829  562  81   BUN 7 - 25 mg/dL 13  11  9    Creatinine 0.50 - 0.96 mg/dL 1.30  8.65  7.84   Sodium 135 - 146 mmol/L 138  138  141   Potassium 3.5 - 5.3 mmol/L 4.5  4.7  4.2   Chloride 98 - 110 mmol/L  103  106  105   CO2 20 - 32 mmol/L 25  24  25    Calcium 8.6 - 10.2 mg/dL 9.2  9.1  9.6   Total Protein 6.1 - 8.1 g/dL 7.0   7.4   Total Bilirubin 0.2 - 1.2 mg/dL 0.4   0.5   AST 10 - 30 U/L 14   17   ALT 6 - 29 U/L 14   19    Lab Results  Component Value Date   IRON 55 04/18/2023   TIBC 424 04/18/2023   IRONPCTSAT 13 04/18/2023   FERRITIN 26 04/18/2023     RADIOGRAPHIC STUDIES: I have personally reviewed the radiological images as listed and agreed with the findings in the report. No results found.

## 2023-05-03 ENCOUNTER — Ambulatory Visit: Payer: MEDICAID

## 2023-05-03 ENCOUNTER — Ambulatory Visit: Payer: Medicaid Other

## 2023-10-19 ENCOUNTER — Ambulatory Visit: Payer: Medicaid Other | Admitting: Family Medicine

## 2023-10-19 ENCOUNTER — Ambulatory Visit: Payer: MEDICAID | Admitting: Family Medicine
